# Patient Record
Sex: Male | Born: 1980 | Race: Black or African American | Hispanic: No | Marital: Single | Smoking: Current some day smoker
Health system: Southern US, Community
[De-identification: ages and names within clinical notes are randomized; demographics above are authoritative.]

## PROBLEM LIST (undated history)

## (undated) DIAGNOSIS — I10 Essential (primary) hypertension: Secondary | ICD-10-CM

## (undated) DIAGNOSIS — K219 Gastro-esophageal reflux disease without esophagitis: Secondary | ICD-10-CM

## (undated) DIAGNOSIS — R569 Unspecified convulsions: Secondary | ICD-10-CM

## (undated) DIAGNOSIS — F17200 Nicotine dependence, unspecified, uncomplicated: Secondary | ICD-10-CM

## (undated) HISTORY — DX: Gastro-esophageal reflux disease without esophagitis: K21.9

## (undated) HISTORY — DX: Essential (primary) hypertension: I10

## (undated) HISTORY — DX: Unspecified convulsions: R56.9

---

## 2004-09-21 ENCOUNTER — Emergency Department: Payer: Self-pay | Admitting: General Practice

## 2004-11-23 ENCOUNTER — Emergency Department: Payer: Self-pay | Admitting: Emergency Medicine

## 2005-01-06 ENCOUNTER — Emergency Department: Payer: Self-pay | Admitting: Emergency Medicine

## 2005-02-24 ENCOUNTER — Emergency Department: Payer: Self-pay | Admitting: Emergency Medicine

## 2005-11-09 ENCOUNTER — Emergency Department: Payer: Self-pay | Admitting: Emergency Medicine

## 2006-03-20 ENCOUNTER — Emergency Department: Payer: Self-pay | Admitting: Emergency Medicine

## 2006-04-03 ENCOUNTER — Emergency Department: Payer: Self-pay | Admitting: Emergency Medicine

## 2006-07-28 ENCOUNTER — Emergency Department: Payer: Self-pay | Admitting: Emergency Medicine

## 2007-04-12 ENCOUNTER — Emergency Department: Payer: Self-pay | Admitting: Unknown Physician Specialty

## 2007-06-02 ENCOUNTER — Emergency Department: Payer: Self-pay | Admitting: Emergency Medicine

## 2008-07-15 ENCOUNTER — Emergency Department: Payer: Self-pay | Admitting: Emergency Medicine

## 2010-03-06 ENCOUNTER — Emergency Department (HOSPITAL_COMMUNITY): Admission: EM | Admit: 2010-03-06 | Discharge: 2010-03-06 | Payer: Self-pay | Admitting: Emergency Medicine

## 2011-04-29 ENCOUNTER — Emergency Department: Payer: Self-pay | Admitting: Emergency Medicine

## 2011-07-20 ENCOUNTER — Emergency Department (HOSPITAL_COMMUNITY): Payer: Medicaid Other

## 2011-07-20 ENCOUNTER — Emergency Department (HOSPITAL_COMMUNITY)
Admission: EM | Admit: 2011-07-20 | Discharge: 2011-07-20 | Disposition: A | Discharge status: Home / Self Care | Payer: Medicaid Other | Attending: Emergency Medicine | Admitting: Emergency Medicine

## 2011-07-20 DIAGNOSIS — M25569 Pain in unspecified knee: Secondary | ICD-10-CM | POA: Insufficient documentation

## 2011-07-20 DIAGNOSIS — IMO0002 Reserved for concepts with insufficient information to code with codable children: Secondary | ICD-10-CM | POA: Insufficient documentation

## 2013-06-29 ENCOUNTER — Emergency Department: Payer: Self-pay | Admitting: Emergency Medicine

## 2014-07-26 ENCOUNTER — Emergency Department: Payer: Self-pay | Admitting: Emergency Medicine

## 2014-07-26 LAB — CBC WITH DIFFERENTIAL/PLATELET
Basophil #: 0.1 10*3/uL (ref 0.0–0.1)
Basophil %: 1 %
EOS PCT: 1.4 %
Eosinophil #: 0.1 10*3/uL (ref 0.0–0.7)
HCT: 49.2 % (ref 40.0–52.0)
HGB: 15.4 g/dL (ref 13.0–18.0)
LYMPHS ABS: 1.3 10*3/uL (ref 1.0–3.6)
LYMPHS PCT: 24.3 %
MCH: 26.6 pg (ref 26.0–34.0)
MCHC: 31.4 g/dL — AB (ref 32.0–36.0)
MCV: 85 fL (ref 80–100)
MONOS PCT: 11.4 %
Monocyte #: 0.6 x10 3/mm (ref 0.2–1.0)
NEUTROS PCT: 61.9 %
Neutrophil #: 3.3 10*3/uL (ref 1.4–6.5)
PLATELETS: 265 10*3/uL (ref 150–440)
RBC: 5.8 10*6/uL (ref 4.40–5.90)
RDW: 14.9 % — AB (ref 11.5–14.5)
WBC: 5.3 10*3/uL (ref 3.8–10.6)

## 2014-07-26 LAB — URINALYSIS, COMPLETE
Bacteria: NONE SEEN
Bilirubin,UR: NEGATIVE
Glucose,UR: NEGATIVE mg/dL (ref 0–75)
Ketone: NEGATIVE
Leukocyte Esterase: NEGATIVE
NITRITE: NEGATIVE
PH: 5 (ref 4.5–8.0)
Protein: 100
Specific Gravity: 1.012 (ref 1.003–1.030)
WBC UR: 1 /HPF (ref 0–5)

## 2014-07-26 LAB — DRUG SCREEN, URINE
Amphetamines, Ur Screen: NEGATIVE (ref ?–1000)
BARBITURATES, UR SCREEN: NEGATIVE (ref ?–200)
BENZODIAZEPINE, UR SCRN: NEGATIVE (ref ?–200)
COCAINE METABOLITE, UR ~~LOC~~: POSITIVE (ref ?–300)
Cannabinoid 50 Ng, Ur ~~LOC~~: NEGATIVE (ref ?–50)
MDMA (ECSTASY) UR SCREEN: NEGATIVE (ref ?–500)
METHADONE, UR SCREEN: NEGATIVE (ref ?–300)
OPIATE, UR SCREEN: NEGATIVE (ref ?–300)
Phencyclidine (PCP) Ur S: NEGATIVE (ref ?–25)
Tricyclic, Ur Screen: NEGATIVE (ref ?–1000)

## 2014-07-26 LAB — COMPREHENSIVE METABOLIC PANEL
ALBUMIN: 3.4 g/dL (ref 3.4–5.0)
ALT: 22 U/L
AST: 25 U/L (ref 15–37)
Alkaline Phosphatase: 85 U/L
Anion Gap: 10 (ref 7–16)
BUN: 13 mg/dL (ref 7–18)
Bilirubin,Total: 0.2 mg/dL (ref 0.2–1.0)
CO2: 22 mmol/L (ref 21–32)
CREATININE: 1.29 mg/dL (ref 0.60–1.30)
Calcium, Total: 8.5 mg/dL (ref 8.5–10.1)
Chloride: 108 mmol/L — ABNORMAL HIGH (ref 98–107)
EGFR (African American): >60
Glucose: 84 mg/dL (ref 65–99)
Osmolality: 279 (ref 275–301)
POTASSIUM: 3.9 mmol/L (ref 3.5–5.1)
SODIUM: 140 mmol/L (ref 136–145)
Total Protein: 7.3 g/dL (ref 6.4–8.2)

## 2014-07-26 LAB — ETHANOL: Ethanol: 99 mg/dL

## 2014-10-15 ENCOUNTER — Inpatient Hospital Stay (HOSPITAL_COMMUNITY)
Admission: EM | Admit: 2014-10-15 | Discharge: 2014-10-21 | DRG: 208 | Payer: Medicaid Other | Attending: General Surgery | Admitting: General Surgery

## 2014-10-15 ENCOUNTER — Emergency Department (HOSPITAL_COMMUNITY): Payer: Medicaid Other

## 2014-10-15 ENCOUNTER — Inpatient Hospital Stay (HOSPITAL_COMMUNITY): Payer: Medicaid Other

## 2014-10-15 ENCOUNTER — Encounter (HOSPITAL_COMMUNITY): Payer: Self-pay | Admitting: Radiology

## 2014-10-15 DIAGNOSIS — R4182 Altered mental status, unspecified: Secondary | ICD-10-CM

## 2014-10-15 DIAGNOSIS — S0101XA Laceration without foreign body of scalp, initial encounter: Secondary | ICD-10-CM | POA: Diagnosis present

## 2014-10-15 DIAGNOSIS — S32029A Unspecified fracture of second lumbar vertebra, initial encounter for closed fracture: Secondary | ICD-10-CM | POA: Diagnosis present

## 2014-10-15 DIAGNOSIS — Z9119 Patient's noncompliance with other medical treatment and regimen: Secondary | ICD-10-CM | POA: Diagnosis present

## 2014-10-15 DIAGNOSIS — R400 Somnolence: Secondary | ICD-10-CM | POA: Diagnosis not present

## 2014-10-15 DIAGNOSIS — S2241XA Multiple fractures of ribs, right side, initial encounter for closed fracture: Principal | ICD-10-CM | POA: Diagnosis present

## 2014-10-15 DIAGNOSIS — Z01818 Encounter for other preprocedural examination: Secondary | ICD-10-CM

## 2014-10-15 DIAGNOSIS — J9690 Respiratory failure, unspecified, unspecified whether with hypoxia or hypercapnia: Secondary | ICD-10-CM

## 2014-10-15 DIAGNOSIS — T402X5A Adverse effect of other opioids, initial encounter: Secondary | ICD-10-CM | POA: Diagnosis not present

## 2014-10-15 DIAGNOSIS — S32039A Unspecified fracture of third lumbar vertebra, initial encounter for closed fracture: Secondary | ICD-10-CM | POA: Diagnosis present

## 2014-10-15 DIAGNOSIS — S32059A Unspecified fracture of fifth lumbar vertebra, initial encounter for closed fracture: Secondary | ICD-10-CM | POA: Diagnosis present

## 2014-10-15 DIAGNOSIS — S32009A Unspecified fracture of unspecified lumbar vertebra, initial encounter for closed fracture: Secondary | ICD-10-CM | POA: Diagnosis present

## 2014-10-15 DIAGNOSIS — T1490XA Injury, unspecified, initial encounter: Secondary | ICD-10-CM

## 2014-10-15 DIAGNOSIS — S32019A Unspecified fracture of first lumbar vertebra, initial encounter for closed fracture: Secondary | ICD-10-CM | POA: Diagnosis present

## 2014-10-15 DIAGNOSIS — G40909 Epilepsy, unspecified, not intractable, without status epilepticus: Secondary | ICD-10-CM

## 2014-10-15 DIAGNOSIS — S32049A Unspecified fracture of fourth lumbar vertebra, initial encounter for closed fracture: Secondary | ICD-10-CM | POA: Diagnosis present

## 2014-10-15 DIAGNOSIS — Z23 Encounter for immunization: Secondary | ICD-10-CM | POA: Diagnosis not present

## 2014-10-15 DIAGNOSIS — R569 Unspecified convulsions: Secondary | ICD-10-CM | POA: Diagnosis present

## 2014-10-15 DIAGNOSIS — T1490 Injury, unspecified: Secondary | ICD-10-CM | POA: Diagnosis present

## 2014-10-15 HISTORY — DX: Nicotine dependence, unspecified, uncomplicated: F17.200

## 2014-10-15 LAB — CBC
HCT: 41.1 % (ref 39.0–52.0)
HEMATOCRIT: 43.6 % (ref 39.0–52.0)
HEMOGLOBIN: 14.5 g/dL (ref 13.0–17.0)
Hemoglobin: 13.6 g/dL (ref 13.0–17.0)
MCH: 27.3 pg (ref 26.0–34.0)
MCH: 27.2 pg (ref 26.0–34.0)
MCHC: 33.3 g/dL (ref 30.0–36.0)
MCHC: 33.1 g/dL (ref 30.0–36.0)
MCV: 82.1 fL (ref 78.0–100.0)
MCV: 82.2 fL (ref 78.0–100.0)
PLATELETS: 251 10*3/uL (ref 150–400)
Platelets: 303 10*3/uL (ref 150–400)
RBC: 5.31 MIL/uL (ref 4.22–5.81)
RBC: 5 MIL/uL (ref 4.22–5.81)
RDW: 17.5 % — ABNORMAL HIGH (ref 11.5–15.5)
RDW: 17.6 % — AB (ref 11.5–15.5)
WBC: 18.6 10*3/uL — ABNORMAL HIGH (ref 4.0–10.5)
WBC: 23.1 10*3/uL — ABNORMAL HIGH (ref 4.0–10.5)

## 2014-10-15 LAB — RAPID URINE DRUG SCREEN, HOSP PERFORMED
AMPHETAMINES: NOT DETECTED
BARBITURATES: NOT DETECTED
Benzodiazepines: NOT DETECTED
Cocaine: POSITIVE — AB
OPIATES: NOT DETECTED
TETRAHYDROCANNABINOL: NOT DETECTED

## 2014-10-15 LAB — COMPREHENSIVE METABOLIC PANEL
ALT: 166 U/L — ABNORMAL HIGH (ref 0–53)
ANION GAP: 18 — AB (ref 5–15)
AST: 252 U/L — ABNORMAL HIGH (ref 0–37)
Albumin: 3.6 g/dL (ref 3.5–5.2)
Alkaline Phosphatase: 98 U/L (ref 39–117)
BILIRUBIN TOTAL: 0.3 mg/dL (ref 0.3–1.2)
BUN: 13 mg/dL (ref 6–23)
CALCIUM: 9.1 mg/dL (ref 8.4–10.5)
CHLORIDE: 99 meq/L (ref 96–112)
CO2: 24 mEq/L (ref 19–32)
CREATININE: 1.65 mg/dL — AB (ref 0.50–1.35)
GFR, EST AFRICAN AMERICAN: 61 mL/min — AB (ref >90)
GFR, EST NON AFRICAN AMERICAN: 52 mL/min — AB (ref >90)
GLUCOSE: 98 mg/dL (ref 70–99)
Potassium: 3.9 mEq/L (ref 3.7–5.3)
Sodium: 141 mEq/L (ref 137–147)
Total Protein: 7.3 g/dL (ref 6.0–8.3)

## 2014-10-15 LAB — URINALYSIS, ROUTINE W REFLEX MICROSCOPIC
BILIRUBIN URINE: NEGATIVE
GLUCOSE, UA: NEGATIVE mg/dL
KETONES UR: NEGATIVE mg/dL
Leukocytes, UA: NEGATIVE
Nitrite: NEGATIVE
Protein, ur: 100 mg/dL — AB
Specific Gravity, Urine: 1.007 (ref 1.005–1.030)
UROBILINOGEN UA: 0.2 mg/dL (ref 0.0–1.0)
pH: 7.5 (ref 5.0–8.0)

## 2014-10-15 LAB — TYPE AND SCREEN
ABO/RH(D): B POS
Antibody Screen: NEGATIVE
Unit division: 0
Unit division: 0

## 2014-10-15 LAB — ETHANOL: ALCOHOL ETHYL (B): 227 mg/dL — AB (ref 0–11)

## 2014-10-15 LAB — PREPARE FRESH FROZEN PLASMA
UNIT DIVISION: 0
Unit division: 0

## 2014-10-15 LAB — I-STAT CHEM 8, ED
BUN: 12 mg/dL (ref 6–23)
CHLORIDE: 102 meq/L (ref 96–112)
Calcium, Ion: 1.04 mmol/L — ABNORMAL LOW (ref 1.12–1.23)
Creatinine, Ser: 1.8 mg/dL — ABNORMAL HIGH (ref 0.50–1.35)
Glucose, Bld: 95 mg/dL (ref 70–99)
HCT: 49 % (ref 39.0–52.0)
Hemoglobin: 16.7 g/dL (ref 13.0–17.0)
Potassium: 3.7 mEq/L (ref 3.7–5.3)
Sodium: 140 mEq/L (ref 137–147)
TCO2: 22 mmol/L (ref 0–100)

## 2014-10-15 LAB — BASIC METABOLIC PANEL
Anion gap: 18 — ABNORMAL HIGH (ref 5–15)
BUN: 13 mg/dL (ref 6–23)
CO2: 23 mEq/L (ref 19–32)
CREATININE: 1.45 mg/dL — AB (ref 0.50–1.35)
Calcium: 8.6 mg/dL (ref 8.4–10.5)
Chloride: 102 mEq/L (ref 96–112)
GFR calc non Af Amer: 61 mL/min — ABNORMAL LOW (ref >90)
GFR, EST AFRICAN AMERICAN: 71 mL/min — AB (ref >90)
Glucose, Bld: 78 mg/dL (ref 70–99)
Potassium: 4.1 mEq/L (ref 3.7–5.3)
Sodium: 143 mEq/L (ref 137–147)

## 2014-10-15 LAB — I-STAT ARTERIAL BLOOD GAS, ED
ACID-BASE DEFICIT: 3 mmol/L — AB (ref 0.0–2.0)
BICARBONATE: 24.5 meq/L — AB (ref 20.0–24.0)
O2 Saturation: 100 %
Patient temperature: 98.6
TCO2: 26 mmol/L (ref 0–100)
pCO2 arterial: 51.6 mmHg — ABNORMAL HIGH (ref 35.0–45.0)
pH, Arterial: 7.284 — ABNORMAL LOW (ref 7.350–7.450)
pO2, Arterial: 417 mmHg — ABNORMAL HIGH (ref 80.0–100.0)

## 2014-10-15 LAB — PROTIME-INR
INR: 1.03 (ref 0.00–1.49)
PROTHROMBIN TIME: 13.6 s (ref 11.6–15.2)

## 2014-10-15 LAB — URINE MICROSCOPIC-ADD ON

## 2014-10-15 LAB — TRIGLYCERIDES: Triglycerides: 242 mg/dL — ABNORMAL HIGH (ref <150)

## 2014-10-15 LAB — ABO/RH: ABO/RH(D): B POS

## 2014-10-15 LAB — MRSA PCR SCREENING: MRSA by PCR: NEGATIVE

## 2014-10-15 LAB — I-STAT CG4 LACTIC ACID, ED: LACTIC ACID, VENOUS: 4.79 mmol/L — AB (ref 0.5–2.2)

## 2014-10-15 LAB — BLOOD PRODUCT ORDER (VERBAL) VERIFICATION

## 2014-10-15 MED ORDER — ENOXAPARIN SODIUM 40 MG/0.4ML ~~LOC~~ SOLN
40.0000 mg | SUBCUTANEOUS | Status: DC
  Administered 2014-10-15 – 2014-10-17 (×3): 40 mg via SUBCUTANEOUS
  Filled 2014-10-15 (×4): qty 0.4

## 2014-10-15 MED ORDER — PROPOFOL 10 MG/ML IV EMUL
INTRAVENOUS | Status: AC
  Administered 2014-10-15: 30 mg
  Filled 2014-10-15: qty 100

## 2014-10-15 MED ORDER — SODIUM CHLORIDE 0.9 % IV SOLN
INTRAVENOUS | Status: DC
  Administered 2014-10-15 – 2014-10-18 (×3): via INTRAVENOUS
  Filled 2014-10-15: qty 1000

## 2014-10-15 MED ORDER — PROPOFOL 10 MG/ML IV EMUL
INTRAVENOUS | Status: AC
  Filled 2014-10-15: qty 100

## 2014-10-15 MED ORDER — SUCCINYLCHOLINE CHLORIDE 20 MG/ML IJ SOLN
INTRAMUSCULAR | Status: AC
Start: 2014-10-15 — End: 2014-10-15
  Filled 2014-10-15: qty 1

## 2014-10-15 MED ORDER — SODIUM CHLORIDE 0.9 % IV SOLN
INTRAVENOUS | Status: AC | PRN
  Administered 2014-10-15 (×2): 1000 mL via INTRAVENOUS

## 2014-10-15 MED ORDER — PANTOPRAZOLE SODIUM 40 MG PO TBEC
40.0000 mg | DELAYED_RELEASE_TABLET | Freq: Every day | ORAL | Status: DC
  Administered 2014-10-17: 40 mg via ORAL
  Filled 2014-10-15: qty 1

## 2014-10-15 MED ORDER — ONDANSETRON HCL 4 MG/2ML IJ SOLN: 4.0000 mg | Freq: Four times a day (QID) | INTRAMUSCULAR | Status: DC | PRN

## 2014-10-15 MED ORDER — DEXTROSE-NACL 5-0.9 % IV SOLN: INTRAVENOUS | Status: DC

## 2014-10-15 MED ORDER — CHLORHEXIDINE GLUCONATE 0.12 % MT SOLN
15.0000 mL | Freq: Two times a day (BID) | OROMUCOSAL | Status: DC
  Administered 2014-10-15 – 2014-10-21 (×12): 15 mL via OROMUCOSAL
  Filled 2014-10-15 (×16): qty 15

## 2014-10-15 MED ORDER — MIDAZOLAM HCL 2 MG/2ML IJ SOLN
2.0000 mg | INTRAMUSCULAR | Status: DC | PRN
  Administered 2014-10-15 (×2): 2 mg via INTRAVENOUS
  Filled 2014-10-15 (×3): qty 2

## 2014-10-15 MED ORDER — ROCURONIUM BROMIDE 50 MG/5ML IV SOLN
INTRAVENOUS | Status: AC
Start: 2014-10-15 — End: 2014-10-15
  Administered 2014-10-15: 50 mg
  Filled 2014-10-15: qty 2

## 2014-10-15 MED ORDER — LIDOCAINE HCL (CARDIAC) 20 MG/ML IV SOLN
INTRAVENOUS | Status: AC
  Filled 2014-10-15: qty 5

## 2014-10-15 MED ORDER — LORAZEPAM 2 MG/ML IJ SOLN
INTRAMUSCULAR | Status: AC | PRN
  Administered 2014-10-15: 2 mg via INTRAVENOUS

## 2014-10-15 MED ORDER — LEVETIRACETAM IN NACL 1000 MG/100ML IV SOLN
1000.0000 mg | Freq: Two times a day (BID) | INTRAVENOUS | Status: DC
  Administered 2014-10-15 – 2014-10-18 (×6): 1000 mg via INTRAVENOUS
  Filled 2014-10-15 (×9): qty 100

## 2014-10-15 MED ORDER — FENTANYL CITRATE 0.05 MG/ML IJ SOLN: 100.0000 ug | INTRAMUSCULAR | Status: DC | PRN

## 2014-10-15 MED ORDER — ETOMIDATE 2 MG/ML IV SOLN
INTRAVENOUS | Status: AC
  Filled 2014-10-15: qty 20

## 2014-10-15 MED ORDER — PROPOFOL 10 MG/ML IV BOLUS
INTRAVENOUS | Status: AC | PRN
  Administered 2014-10-15: 1000 ug via INTRAVENOUS

## 2014-10-15 MED ORDER — MIDAZOLAM HCL 2 MG/2ML IJ SOLN
INTRAMUSCULAR | Status: AC
  Filled 2014-10-15: qty 6

## 2014-10-15 MED ORDER — FENTANYL CITRATE 0.05 MG/ML IJ SOLN
100.0000 ug | INTRAMUSCULAR | Status: DC | PRN
Start: 2014-10-15 — End: 2014-10-18
  Administered 2014-10-15 – 2014-10-18 (×7): 100 ug via INTRAVENOUS
  Filled 2014-10-15 (×8): qty 2

## 2014-10-15 MED ORDER — PANTOPRAZOLE SODIUM 40 MG IV SOLR
40.0000 mg | Freq: Every day | INTRAVENOUS | Status: DC
  Administered 2014-10-15 – 2014-10-16 (×2): 40 mg via INTRAVENOUS
  Filled 2014-10-15 (×4): qty 40

## 2014-10-15 MED ORDER — MIDAZOLAM HCL 2 MG/2ML IJ SOLN
2.0000 mg | INTRAMUSCULAR | Status: DC | PRN
Start: 2014-10-15 — End: 2014-10-17
  Administered 2014-10-15 (×3): 2 mg via INTRAVENOUS
  Filled 2014-10-15 (×3): qty 2

## 2014-10-15 MED ORDER — MIDAZOLAM HCL 5 MG/5ML IJ SOLN
INTRAMUSCULAR | Status: AC | PRN
  Administered 2014-10-15 (×3): 5 mg via INTRAVENOUS

## 2014-10-15 MED ORDER — IOHEXOL 300 MG/ML  SOLN
100.0000 mL | Freq: Once | INTRAMUSCULAR | Status: AC | PRN
  Administered 2014-10-15: 100 mL via INTRAVENOUS

## 2014-10-15 MED ORDER — ETOMIDATE 2 MG/ML IV SOLN
INTRAVENOUS | Status: AC | PRN
  Administered 2014-10-15: 20 mg via INTRAVENOUS

## 2014-10-15 MED ORDER — SUCCINYLCHOLINE CHLORIDE 20 MG/ML IJ SOLN
INTRAMUSCULAR | Status: AC | PRN
  Administered 2014-10-15: 100 mg via INTRAVENOUS

## 2014-10-15 MED ORDER — PROPOFOL 10 MG/ML IV EMUL
0.0000 ug/kg/min | INTRAVENOUS | Status: DC
  Administered 2014-10-15 (×4): 40 ug/kg/min via INTRAVENOUS
  Administered 2014-10-16: 10 ug/kg/min via INTRAVENOUS
  Administered 2014-10-16: 40 ug/kg/min via INTRAVENOUS
  Filled 2014-10-15 (×5): qty 100

## 2014-10-15 MED ORDER — CETYLPYRIDINIUM CHLORIDE 0.05 % MT LIQD
7.0000 mL | Freq: Four times a day (QID) | OROMUCOSAL | Status: DC
  Administered 2014-10-15 – 2014-10-20 (×12): 7 mL via OROMUCOSAL

## 2014-10-15 MED ORDER — SODIUM CHLORIDE 0.9 % IV SOLN
2000.0000 mg | INTRAVENOUS | Status: AC
  Administered 2014-10-15: 2000 mg via INTRAVENOUS
  Filled 2014-10-15: qty 20

## 2014-10-15 MED ORDER — ONDANSETRON HCL 4 MG PO TABS: 4.0000 mg | ORAL_TABLET | Freq: Four times a day (QID) | ORAL | Status: DC | PRN

## 2014-10-15 MED ORDER — MIDAZOLAM HCL 2 MG/2ML IJ SOLN
INTRAMUSCULAR | Status: AC
  Administered 2014-10-15: 5 mg via INTRAVENOUS
  Filled 2014-10-15: qty 6
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
## 2014-10-15 NOTE — Procedures (Signed)
EEG report.  Brief clinical history:  33 yo M with sz in the setting of cocaine and etoh use. Unclear if other substances as well.  Technique: this is a 17 channel routine scalp EEG performed at the bedside with bipolar and monopolar montages arranged in accordance to the international 10/20 system of electrode placement. One channel was dedicated to EKG recording.  Patient is intubated on the vent, sedated with propofol. No activating procedures performed.  Description: patient is intubated on the vent and sedated with propofol and thus assessment of the basic background rhythm was not feasible. For the most part, the recording displays features consistent with sedation and there is not evidence of electrographic seizures or epileptiform discharges. No pathologic areas of slowing seen.  EKG showed sinus rhythm.  Impression: this is a normal EEG with findings compatible with sedation and no evidence of electrographic seizures.  Please, be aware that a normal EEG does not exclude the possibility of epilepsy.  Clinical correlation is advised.  Wyatt Portelasvaldo Alton Tremblay, MD
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
## 2014-10-15 NOTE — ED Notes (Signed)
Diprivan Gtt increased to 6930mcg/kg/min 18cc on pump

## 2014-10-15 NOTE — ED Notes (Signed)
Returned from ct scan , pt given 1000 NS bolus in ct scan due to drop in Sb/p , pressure up with one liter in
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
## 2014-10-15 NOTE — ED Notes (Signed)
Pt transported to 2s room 7

## 2014-10-15 NOTE — Plan of Care (Signed)
Problem: Phase I Progression Outcomes Goal: GIProphysixis Outcome: Completed/Met Date Met:  10/15/14 Goal: Oral Care per Protocol Outcome: Completed/Met Date Met:  10/15/14 Goal: HOB elevated 30 degrees Outcome: Completed/Met Date Met:  10/15/14 Goal: VAP prevention protocol initiated Outcome: Completed/Met Date Met:  10/15/14

## 2014-10-15 NOTE — ED Notes (Signed)
Patient having seizures at this time.  New orders per Dr Ranae PalmsYelverton.

## 2014-10-15 NOTE — ED Notes (Signed)
To Ct with RN.
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
## 2014-10-15 NOTE — Progress Notes (Signed)
Patient ID: Daniel Goodman, male   DOB: 03/07/1981, 33 y.o.   MRN: [id number] Just admitted from ED. BS equal but coarse. Weaning but sedated. Adjusted IVF. Violeta GelinasBurke Fuller Makin, MD, MPH, FACS Trauma: [phone] General Surgery: [phone]
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
## 2014-10-15 NOTE — ED Notes (Signed)
5 mg Versed given due to pt pulling at tube , Neuro MD at bedside

## 2014-10-15 NOTE — ED Notes (Signed)
Attempted report 

## 2014-10-15 NOTE — Progress Notes (Signed)
Patient ID: Daniel Goodman, male   DOB: 03/06/1981, [age] y.oCarlye Goodman.   MRN: [id number] Called by RN for possible pupil difference. On my exam, pupils equal and 2mm. Sluggish. EEG results noted. Weaning well. Hope to extubate in the AM. Daniel GelinasBurke Lotoya Casella, MD, MPH, FACS Trauma: [phone] General Surgery: [phone]

## 2014-10-15 NOTE — Progress Notes (Signed)
EEG completed, results pending. 

## 2014-10-15 NOTE — Progress Notes (Signed)
Patient ID: Daniel Goodman, male   DOB: May 26, 1981, 33 y.o.   MRN: [id number] I met with his parents at the bedside and updated them on the plan of care. Georganna Skeans, MD, MPH, FACS Trauma: [phone] General Surgery: [phone]
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
## 2014-10-16 ENCOUNTER — Inpatient Hospital Stay (HOSPITAL_COMMUNITY): Payer: Medicaid Other

## 2014-10-16 ENCOUNTER — Encounter (HOSPITAL_COMMUNITY): Payer: Self-pay | Admitting: Certified Registered Nurse Anesthetist

## 2014-10-16 LAB — BASIC METABOLIC PANEL
ANION GAP: 12 (ref 5–15)
BUN: 11 mg/dL (ref 6–23)
CHLORIDE: 104 meq/L (ref 96–112)
CO2: 24 mEq/L (ref 19–32)
Calcium: 8.7 mg/dL (ref 8.4–10.5)
Creatinine, Ser: 1.2 mg/dL (ref 0.50–1.35)
GFR calc non Af Amer: 77 mL/min — ABNORMAL LOW (ref >90)
GFR, EST AFRICAN AMERICAN: 89 mL/min — AB (ref >90)
Glucose, Bld: 91 mg/dL (ref 70–99)
POTASSIUM: 4.3 meq/L (ref 3.7–5.3)
Sodium: 140 mEq/L (ref 137–147)

## 2014-10-16 LAB — CBC
HCT: 36.4 % — ABNORMAL LOW (ref 39.0–52.0)
Hemoglobin: 11.8 g/dL — ABNORMAL LOW (ref 13.0–17.0)
MCH: 26.9 pg (ref 26.0–34.0)
MCHC: 32.4 g/dL (ref 30.0–36.0)
MCV: 82.9 fL (ref 78.0–100.0)
Platelets: 217 10*3/uL (ref 150–400)
RBC: 4.39 MIL/uL (ref 4.22–5.81)
RDW: 18 % — ABNORMAL HIGH (ref 11.5–15.5)
WBC: 8.5 10*3/uL (ref 4.0–10.5)

## 2014-10-16 LAB — CDS SEROLOGY

## 2014-10-16 MED ORDER — INFLUENZA VAC SPLIT QUAD 0.5 ML IM SUSY
0.5000 mL | PREFILLED_SYRINGE | INTRAMUSCULAR | Status: DC
  Filled 2014-10-16: qty 0.5

## 2014-10-16 MED ORDER — HYDROMORPHONE HCL 1 MG/ML IJ SOLN
1.0000 mg | INTRAMUSCULAR | Status: DC | PRN
  Administered 2014-10-16 – 2014-10-18 (×5): 1 mg via INTRAVENOUS
  Filled 2014-10-16 (×5): qty 1

## 2014-10-16 MED ORDER — OXYCODONE HCL 5 MG PO TABS
5.0000 mg | ORAL_TABLET | ORAL | Status: DC | PRN
  Administered 2014-10-16 – 2014-10-18 (×9): 10 mg via ORAL
  Filled 2014-10-16 (×9): qty 2

## 2014-10-16 MED ORDER — PNEUMOCOCCAL VAC POLYVALENT 25 MCG/0.5ML IJ INJ
0.5000 mL | INJECTION | INTRAMUSCULAR | Status: DC
  Filled 2014-10-16: qty 0.5

## 2014-10-16 MED ORDER — DEXTROSE 5 % IV SOLN
1000.0000 mg | Freq: Three times a day (TID) | INTRAVENOUS | Status: DC | PRN
  Administered 2014-10-17 – 2014-10-18 (×3): 1000 mg via INTRAVENOUS
  Filled 2014-10-16 (×7): qty 10

## 2014-10-16 NOTE — Procedures (Signed)
Extubation Procedure Note  Patient Details:   Name: Daniel Goodman DOB: 12/15/1980 MRN: [id number]   Airway Documentation:     Evaluation  O2 sats: stable throughout Complications: No apparent complications Patient did tolerate procedure well. Bilateral Breath Sounds: Clear, Diminished Suctioning: Airway Yes   Patient extubated to 3L nasal cannula per MD order.  Positive cuff leak noted.  No evidence of stridor.  Sats currently 100%.  Vitals are stable.  Patient able to speak post extubation.  Incentive spirometry performed with achieved goal of 500.  No apparent complications.  Durwin GlazeBrown, Gavon Majano N 10/16/2014, 10:49 AM

## 2014-10-16 NOTE — Progress Notes (Signed)
[employer] has been notified of the intent to extubate patient, they informed RN to call when patient is medically released by MD so officer can pick patient up. Instructed to call back and notify them if patient becomes resistant to care. Called to inform house security of this current matter and expressed a need to have them present when extubated just in case the patient becomes resistant to care, they are contacting [facility] personally before we follow through with extubation. He is currently cooperative with care and following commands. VSS, family informed and updated on current condition. Will continue to monitor. Franki CabotBrianna Mahima Hottle, RN

## 2014-10-16 NOTE — Progress Notes (Signed)
Trooper Art therapistMann at bedside and updating family on patient's legal status. Will proceed with extubation. Contacted MD to clarify plan for patient, informed RN that patient may be staying in the hospital for about 2 more days depending on physical therapy. Will pass on information to W[employer] W[employer]ley Jr[employer] Companyrooper Mann with [employer]. Franki CabotBrianna Odena Mcquaid, RN
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
## 2014-10-17 DIAGNOSIS — G40909 Epilepsy, unspecified, not intractable, without status epilepticus: Secondary | ICD-10-CM

## 2014-10-17 LAB — CBC
HEMATOCRIT: 33.6 % — AB (ref 39.0–52.0)
Hemoglobin: 11.1 g/dL — ABNORMAL LOW (ref 13.0–17.0)
MCH: 27.5 pg (ref 26.0–34.0)
MCHC: 33 g/dL (ref 30.0–36.0)
MCV: 83.2 fL (ref 78.0–100.0)
PLATELETS: 184 10*3/uL (ref 150–400)
RBC: 4.04 MIL/uL — AB (ref 4.22–5.81)
RDW: 17.3 % — ABNORMAL HIGH (ref 11.5–15.5)
WBC: 8.8 10*3/uL (ref 4.0–10.5)

## 2014-10-17 LAB — BASIC METABOLIC PANEL
Anion gap: 11 (ref 5–15)
BUN: 7 mg/dL (ref 6–23)
CALCIUM: 8.8 mg/dL (ref 8.4–10.5)
CO2: 25 mEq/L (ref 19–32)
CREATININE: 1.05 mg/dL (ref 0.50–1.35)
Chloride: 100 mEq/L (ref 96–112)
GFR calc Af Amer: >90 mL/min (ref >90)
GFR calc non Af Amer: >90 mL/min (ref >90)
Glucose, Bld: 87 mg/dL (ref 70–99)
Potassium: 3.8 mEq/L (ref 3.7–5.3)
Sodium: 136 mEq/L — ABNORMAL LOW (ref 137–147)

## 2014-10-17 MED ORDER — THIAMINE HCL 100 MG/ML IJ SOLN
100.0000 mg | Freq: Every day | INTRAMUSCULAR | Status: DC
  Administered 2014-10-19: 100 mg via INTRAVENOUS
  Filled 2014-10-17 (×3): qty 1

## 2014-10-17 MED ORDER — LORAZEPAM 1 MG PO TABS
1.0000 mg | ORAL_TABLET | Freq: Four times a day (QID) | ORAL | Status: AC | PRN
  Administered 2014-10-17 – 2014-10-18 (×3): 1 mg via ORAL
  Filled 2014-10-17 (×3): qty 1

## 2014-10-17 MED ORDER — PNEUMOCOCCAL VAC POLYVALENT 25 MCG/0.5ML IJ INJ
0.5000 mL | INJECTION | INTRAMUSCULAR | Status: AC
  Administered 2014-10-18: 0.5 mL via INTRAMUSCULAR
  Filled 2014-10-17: qty 0.5

## 2014-10-17 MED ORDER — KETOROLAC TROMETHAMINE 15 MG/ML IJ SOLN
15.0000 mg | Freq: Four times a day (QID) | INTRAMUSCULAR | Status: DC
  Administered 2014-10-17 – 2014-10-18 (×5): 15 mg via INTRAVENOUS
  Filled 2014-10-17 (×6): qty 1

## 2014-10-17 MED ORDER — INFLUENZA VAC SPLIT QUAD 0.5 ML IM SUSY: 0.5000 mL | PREFILLED_SYRINGE | INTRAMUSCULAR | Status: DC | PRN

## 2014-10-17 MED ORDER — FOLIC ACID 1 MG PO TABS
1.0000 mg | ORAL_TABLET | Freq: Every day | ORAL | Status: DC
  Administered 2014-10-17 – 2014-10-21 (×5): 1 mg via ORAL
  Filled 2014-10-17 (×5): qty 1

## 2014-10-17 MED ORDER — LORAZEPAM 2 MG/ML IJ SOLN
1.0000 mg | Freq: Four times a day (QID) | INTRAMUSCULAR | Status: AC | PRN
  Filled 2014-10-17: qty 1

## 2014-10-17 MED ORDER — VITAMIN B-1 100 MG PO TABS
100.0000 mg | ORAL_TABLET | Freq: Every day | ORAL | Status: DC
Start: 2014-10-17 — End: 2014-10-18
  Administered 2014-10-17 – 2014-10-18 (×2): 100 mg via ORAL
  Filled 2014-10-17 (×2): qty 1

## 2014-10-17 MED ORDER — ADULT MULTIVITAMIN W/MINERALS CH
1.0000 | ORAL_TABLET | Freq: Every day | ORAL | Status: DC
  Administered 2014-10-17 – 2014-10-21 (×5): 1 via ORAL
  Filled 2014-10-17 (×5): qty 1

## 2014-10-17 MED ORDER — BOOST / RESOURCE BREEZE PO LIQD
1.0000 | Freq: Three times a day (TID) | ORAL | Status: DC
  Administered 2014-10-17 – 2014-10-21 (×6): 1 via ORAL
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
## 2014-10-17 NOTE — Progress Notes (Signed)
Called report to the receiving nurse on 5N - explained that this is a confidential pt - password provided.  [employer] were notified of transfer of the pt to the floor and nurse was advised that the [employer] need to be contacted when the pt is to be discharged - information on front of the pt's chart.  Advised that the pt is not aware of the status of his cousin and that the family is aware of his legal status.

## 2014-10-18 DIAGNOSIS — S32009A Unspecified fracture of unspecified lumbar vertebra, initial encounter for closed fracture: Secondary | ICD-10-CM

## 2014-10-18 DIAGNOSIS — S0101XA Laceration without foreign body of scalp, initial encounter: Secondary | ICD-10-CM

## 2014-10-18 DIAGNOSIS — S2241XA Multiple fractures of ribs, right side, initial encounter for closed fracture: Secondary | ICD-10-CM

## 2014-10-18 HISTORY — DX: Multiple fractures of ribs, right side, initial encounter for closed fracture: S22.41XA

## 2014-10-18 HISTORY — DX: Laceration without foreign body of scalp, initial encounter: S01.01XA

## 2014-10-18 HISTORY — DX: Unspecified fracture of unspecified lumbar vertebra, initial encounter for closed fracture: S32.009A

## 2014-10-18 MED ORDER — OXYCODONE HCL 5 MG PO TABS
10.0000 mg | ORAL_TABLET | ORAL | Status: DC | PRN
  Administered 2014-10-18 – 2014-10-19 (×6): 20 mg via ORAL
  Administered 2014-10-19: 15 mg via ORAL
  Administered 2014-10-19 – 2014-10-21 (×7): 20 mg via ORAL
  Filled 2014-10-18 (×15): qty 4

## 2014-10-18 MED ORDER — METHOCARBAMOL 750 MG PO TABS
1500.0000 mg | ORAL_TABLET | Freq: Four times a day (QID) | ORAL | Status: DC
  Administered 2014-10-18 – 2014-10-21 (×12): 1500 mg via ORAL
  Filled 2014-10-18 (×18): qty 2

## 2014-10-18 MED ORDER — DOCUSATE SODIUM 100 MG PO CAPS
100.0000 mg | ORAL_CAPSULE | Freq: Two times a day (BID) | ORAL | Status: DC
  Administered 2014-10-18 – 2014-10-21 (×7): 100 mg via ORAL
  Filled 2014-10-18 (×8): qty 1

## 2014-10-18 MED ORDER — BACITRACIN ZINC 500 UNIT/GM EX OINT
TOPICAL_OINTMENT | Freq: Two times a day (BID) | CUTANEOUS | Status: DC
  Administered 2014-10-18 – 2014-10-21 (×7): via TOPICAL
  Filled 2014-10-18: qty 28.35

## 2014-10-18 MED ORDER — HYDROMORPHONE HCL 1 MG/ML IJ SOLN
0.5000 mg | INTRAMUSCULAR | Status: DC | PRN
  Administered 2014-10-19 (×2): 0.5 mg via INTRAVENOUS
  Filled 2014-10-18 (×2): qty 1

## 2014-10-18 MED ORDER — ENOXAPARIN SODIUM 30 MG/0.3ML ~~LOC~~ SOLN
30.0000 mg | Freq: Two times a day (BID) | SUBCUTANEOUS | Status: DC
  Administered 2014-10-18 – 2014-10-21 (×7): 30 mg via SUBCUTANEOUS
  Filled 2014-10-18 (×8): qty 0.3

## 2014-10-18 MED ORDER — NAPROXEN 500 MG PO TABS
500.0000 mg | ORAL_TABLET | Freq: Two times a day (BID) | ORAL | Status: DC
  Administered 2014-10-18 – 2014-10-21 (×6): 500 mg via ORAL
  Filled 2014-10-18 (×8): qty 1

## 2014-10-18 MED ORDER — LEVETIRACETAM 500 MG PO TABS
1000.0000 mg | ORAL_TABLET | Freq: Two times a day (BID) | ORAL | Status: DC
  Administered 2014-10-18 – 2014-10-21 (×6): 1000 mg via ORAL
  Filled 2014-10-18 (×7): qty 2

## 2014-10-18 MED ORDER — POLYETHYLENE GLYCOL 3350 17 G PO PACK
17.0000 g | PACK | Freq: Every day | ORAL | Status: DC
  Administered 2014-10-18 – 2014-10-21 (×4): 17 g via ORAL
  Filled 2014-10-18 (×5): qty 1

## 2014-10-18 NOTE — Progress Notes (Signed)
UR completed.  PT recommending outpt PT at d/c. Pt has Medicaid coverage so should be able to fill any necessary prescriptions. Has family support also (parents).  Carlyle LipaMichelle Laveda Demedeiros, RN BSN MHA CCM Trauma/Neuro ICU Case Manager [phone]
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
## 2014-10-18 NOTE — Plan of Care (Signed)
Problem: Phase III Progression Outcomes Goal: Pain controlled with appropriate interventions Outcome: Progressing Goal: Hemodynamically stable Outcome: Progressing Goal: Activity advanced as tolerated Outcome: Progressing
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
## 2014-10-18 NOTE — Progress Notes (Signed)
Dale DurhamMichael Jeffries, PA said that patient does need suicide precautions.

## 2014-10-19 MED ORDER — ACETAMINOPHEN 325 MG PO TABS: 325.0000 mg | ORAL_TABLET | Freq: Four times a day (QID) | ORAL | Status: DC | PRN

## 2014-10-19 MED ORDER — VITAMIN B-1 100 MG PO TABS
100.0000 mg | ORAL_TABLET | Freq: Every day | ORAL | Status: DC
  Administered 2014-10-20 – 2014-10-21 (×2): 100 mg via ORAL
  Filled 2014-10-19 (×2): qty 1
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
## 2014-10-21 ENCOUNTER — Emergency Department: Payer: Self-pay | Admitting: Emergency Medicine

## 2014-10-21 MED ORDER — ACETAMINOPHEN 325 MG PO TABS: 325.0000 mg | ORAL_TABLET | Freq: Four times a day (QID) | ORAL | Status: DC | PRN

## 2014-10-21 MED ORDER — LEVETIRACETAM 1000 MG PO TABS: 1000.0000 mg | ORAL_TABLET | Freq: Two times a day (BID) | ORAL | Status: DC

## 2014-10-21 MED ORDER — OXYCODONE HCL 10 MG PO TABS: 5.0000 mg | ORAL_TABLET | Freq: Four times a day (QID) | ORAL | Status: DC | PRN

## 2014-10-21 MED ORDER — THIAMINE HCL 100 MG PO TABS
100.0000 mg | ORAL_TABLET | Freq: Every day | ORAL | Status: DC
Start: 2014-10-21 — End: 2015-09-26

## 2014-10-21 MED ORDER — METHOCARBAMOL 750 MG PO TABS: 1500.0000 mg | ORAL_TABLET | Freq: Four times a day (QID) | ORAL | Status: DC

## 2014-10-21 MED ORDER — BACITRACIN ZINC 500 UNIT/GM EX OINT: TOPICAL_OINTMENT | Freq: Two times a day (BID) | CUTANEOUS | Status: DC

## 2014-10-21 MED ORDER — FOLIC ACID 1 MG PO TABS: 1.0000 mg | ORAL_TABLET | Freq: Every day | ORAL | Status: DC

## 2014-10-21 MED ORDER — ADULT MULTIVITAMIN W/MINERALS CH: 1.0000 | ORAL_TABLET | Freq: Every day | ORAL | Status: DC

## 2014-10-21 MED ORDER — POLYETHYLENE GLYCOL 3350 17 G PO PACK: 17.0000 g | PACK | Freq: Every day | ORAL | Status: DC

## 2014-10-21 MED ORDER — NAPROXEN 500 MG PO TABS: 500.0000 mg | ORAL_TABLET | Freq: Two times a day (BID) | ORAL | Status: DC

## 2014-10-21 MED ORDER — DSS 100 MG PO CAPS: 100.0000 mg | ORAL_CAPSULE | Freq: Two times a day (BID) | ORAL | Status: DC
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
## 2014-10-21 NOTE — Discharge Instructions (Signed)
Make appointments with a neurologist and primary care provider ASAP!!!  Rib Fracture A rib fracture is a break or crack in one of the bones of the ribs. The ribs are a group of long, curved bones that wrap around your chest and attach to your spine. They protect your lungs and other organs in the chest cavity. A broken or cracked rib is often painful, but most do not cause other problems. Most rib fractures heal on their own over time. However, rib fractures can be more serious if multiple ribs are broken or if broken ribs move out of place and push against other structures. CAUSES   A direct blow to the chest. For example, this could happen during contact sports, a car accident, or a fall against a hard object.  Repetitive movements with high force, such as pitching a baseball or having severe coughing spells. SYMPTOMS   Pain when you breathe in or cough.  Pain when someone presses on the injured area. DIAGNOSIS  Your caregiver will perform a physical exam. Various imaging tests may be ordered to confirm the diagnosis and to look for related injuries. These tests may include a chest X-ray, computed tomography (CT), magnetic resonance imaging (MRI), or a bone scan. TREATMENT  Rib fractures usually heal on their own in 1-3 months. The longer healing period is often associated with a continued cough or other aggravating activities. During the healing period, pain control is very important. Medication is usually given to control pain. Hospitalization or surgery may be needed for more severe injuries, such as those in which multiple ribs are broken or the ribs have moved out of place.  HOME CARE INSTRUCTIONS   Avoid strenuous activity and any activities or movements that cause pain. Be careful during activities and avoid bumping the injured rib.  Gradually increase activity as directed by your caregiver.  Only take over-the-counter or prescription medications as directed by your caregiver. Do not  take other medications without asking your caregiver first.  Apply ice to the injured area for the first 1-2 days after you have been treated or as directed by your caregiver. Applying ice helps to reduce inflammation and pain.  Put ice in a plastic bag.  Place a towel between your skin and the bag.   Leave the ice on for 15-20 minutes at a time, every 2 hours while you are awake.  Perform deep breathing as directed by your caregiver. This will help prevent pneumonia, which is a common complication of a broken rib. Your caregiver may instruct you to:  Take deep breaths several times a day.  Try to cough several times a day, holding a pillow against the injured area.  Use a device called an incentive spirometer to practice deep breathing several times a day.  Drink enough fluids to keep your urine clear or pale yellow. This will help you avoid constipation.   Do not wear a rib belt or binder. These restrict breathing, which can lead to pneumonia.  SEEK IMMEDIATE MEDICAL CARE IF:   You have a fever.   You have difficulty breathing or shortness of breath.   You develop a continual cough, or you cough up thick or bloody sputum.  You feel sick to your stomach (nausea), throw up (vomit), or have abdominal pain.   You have worsening pain not controlled with medications.  MAKE SURE YOU:  Understand these instructions.  Will watch your condition.  Will get help right away if you are not doing well  or get worse. Document Released: 10/25/2005 Document Revised: 06/27/2013 Document Reviewed: 12/27/2012 [facility] Patient Information 2015 [place], [place]. This information is not intended to replace advice given to you by your health care provider. Make sure you discuss any questions you have with your health care provider.  Transverse Process Fracture A fracture of a bone is the same as a break in the bone. A fracture of a transverse process is a break of a part of one of the bones  in the spine. This part extends out from the side of the main body of the bone (called the vertebral body). A transverse process is shaped like a wing. They extend from both the left and right sides of the vertebral body. Many of these injuries occur in the thoracic spine (the upper and middle parts of the back) and the lumbar spine (the low back area). In the elderly, these injuries can also occur in the lower neck area and are affected by age-related arthritis problems and osteoporosis (thinning of bone). It takes a lot of force to cause this type of fracture. Because other organs and so many other parts of the spine are close to the transverse processes, these fractures usually occur at the same time as injuries to:  Other bones.  Organs.  Possibly the spinal cord. Even if there is only a break to one transverse process, your caregiver will take steps to make sure that a nearby organ has not also been injured.  CAUSES  Most of these injuries occur as a result of a variety of accidents such as:  Falls.  Motor vehicle accidents.  Recreational activities.  A smaller number occur due to:  Industrial, Water engineer, and aviation accidents.  Gunshot wounds and direct blows to the back.  Parachuting incidents. SYMPTOMS  Patients with transverse process fractures have severe pain even if the actual break is small or limited and there is no injury to nearby bones, organs, or the spinal cord. More severe or complex injuries involving other bones and/or organs may include:  Deformity of the back bones.  Swelling/bruising over the injured area.  Limited ability to move the affected area.  There may be injury to a nearby:  Lung.  Kidney.  Spleen.  Liver. Injury to nearby nerves can cause partial or complete loss of function of the bladder and/or bowels. More severe injuries can also cause:  Loss of sensation and/or strength in the arms/hands (if the break is in the lower part of  the neck), legs, feet, and toes.  Spinal cord injury that leads to paralysis. DIAGNOSIS In most cases, a broken bone will be suspected by what happened just prior to the onset of back and/or neck pain. X-rays and special imaging (CT scan and MRI imaging) are used to confirm the diagnosis as well as finding out the type and severity of the break or breaks. These tests are important for guiding and planning treatment. There are times when special imaging cannot be done. MRI cannot be done if there is an implanted metallic device (such as a pacemaker). In these cases, other tests and imaging are done. Other tests may be done if your caregiver is concerned about injuries to internal organs near the break of the transverse process. For example, an ultrasound may be ordered to examine the liver, spleen, or kidneys. If there has been nerve damage near the break, additional tests may be ordered in order to find out:  Exactly how much damage has occurred.  To plan  for what can be done to help. These include:  Tests of nerve function through muscles (nerve conduction studies and electromyography).  Tests of bladder function (urodynamics).  Tests that focus on defining specific nerve problems before surgery and what improvement has come about after surgery (evoked potentials). TREATMENT If the injury is limited to a break of a transverse process with no other bone or organ injury, hospital care may not be necessary. Medication for pain control, special back bracing, and limitations in activity are done first followed by physical therapy later. Complex breaks, multiple fractures of the spine, or unstable injuries can damage the spinal cord and may require an operation to remove pressure from the nerves and/or spinal cord and to stabilize the broken pieces of bone. Specialty care may be needed if there has been injury to an internal organ near a broken transverse process. Each individual set of injuries is  unique, and your caregiver will review many things when planning the best approach that will give the highest likelihood of a good outcome.  HOME CARE INSTRUCTIONS There is pain and stiffness in the back for weeks after a transverse process fracture. Bed rest, pain medicine, and a slow return to activity are generally recommended. Neck and back braces may be helpful in reducing pain and increasing mobility. When your pain allows, simple walking will help to begin the process of returning to normal activities. Exercises to improve motion and to strengthen the back may also be useful after the initial pain goes away. This will be guided by your caregiver and the team (nurses, physical therapists, occupational therapists, etc.) involved with your ongoing care. For the elderly, treatment for osteoporosis may be essential to help reduce your risk of fractures in the future. Arrange for follow-up care as recommended to assure proper long-term care and prevention of further spine injury. It is important that you participate in your return to good health. The failure to follow up as recommended with your caregiver, orthopedic referral or other provider may result in the bone not healing properly with possible chronic disability or pain. SEEK MEDICAL CARE IF:  Pain is not effectively controlled with medication.  You feel unable to decrease pain medication over time as planned.  Activity level is not improving as planned and/or expected. SEEK IMMEDIATE MEDICAL CARE IF:  You have increasing pain, vomiting, or are unable to move around at all.  You have numbness, tingling, weakness, or paralysis of any part of your body.  You have loss of normal bowel or bladder control.  You have difficulty breathing, cough, fever, chest or abdominal pain. MAKE SURE YOU:   Understand these instructions.  Will watch your condition.  Will get help right away if you are not doing well or get worse. Document Released:  02/09/2007 Document Revised: 03/11/2014 Document Reviewed: 10/10/2007 [facility] Patient Information 2015 [place], [place]. This information is not intended to replace advice given to you by your health care provider. Make sure you discuss any questions you have with your health care provider.
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
## 2015-09-25 DIAGNOSIS — J323 Chronic sphenoidal sinusitis: Secondary | ICD-10-CM | POA: Insufficient documentation

## 2015-09-25 DIAGNOSIS — J36 Peritonsillar abscess: Secondary | ICD-10-CM | POA: Diagnosis not present

## 2015-09-25 DIAGNOSIS — R0602 Shortness of breath: Secondary | ICD-10-CM | POA: Insufficient documentation

## 2015-09-25 DIAGNOSIS — R0600 Dyspnea, unspecified: Secondary | ICD-10-CM | POA: Insufficient documentation

## 2015-09-25 DIAGNOSIS — R591 Generalized enlarged lymph nodes: Secondary | ICD-10-CM | POA: Diagnosis not present

## 2015-09-25 DIAGNOSIS — Z79899 Other long term (current) drug therapy: Secondary | ICD-10-CM | POA: Diagnosis not present

## 2015-09-25 DIAGNOSIS — G40909 Epilepsy, unspecified, not intractable, without status epilepticus: Secondary | ICD-10-CM | POA: Diagnosis not present

## 2015-09-26 ENCOUNTER — Emergency Department: Payer: Medicaid Other

## 2015-09-26 ENCOUNTER — Encounter: Payer: Self-pay | Admitting: Radiology

## 2015-09-26 ENCOUNTER — Observation Stay
Admission: EM | Admit: 2015-09-26 | Discharge: 2015-09-26 | Disposition: A | Discharge status: Home / Self Care | Payer: Medicaid Other | Attending: Internal Medicine | Admitting: Internal Medicine

## 2015-09-26 DIAGNOSIS — J36 Peritonsillar abscess: Secondary | ICD-10-CM

## 2015-09-26 DIAGNOSIS — K651 Peritoneal abscess: Secondary | ICD-10-CM | POA: Diagnosis present

## 2015-09-26 HISTORY — DX: Peritonsillar abscess: J36

## 2015-09-26 HISTORY — DX: Peritoneal abscess: K65.1

## 2015-09-26 LAB — COMPREHENSIVE METABOLIC PANEL
ALBUMIN: 3.1 g/dL — AB (ref 3.5–5.0)
ALK PHOS: 65 U/L (ref 38–126)
ALT: 13 U/L — AB (ref 17–63)
AST: 12 U/L — AB (ref 15–41)
Anion gap: 11 (ref 5–15)
BILIRUBIN TOTAL: 0.3 mg/dL (ref 0.3–1.2)
BUN: 14 mg/dL (ref 6–20)
CO2: 26 mmol/L (ref 22–32)
CREATININE: 1.13 mg/dL (ref 0.61–1.24)
Calcium: 9 mg/dL (ref 8.9–10.3)
Chloride: 98 mmol/L — ABNORMAL LOW (ref 101–111)
GFR calc Af Amer: >60 mL/min (ref >60)
GLUCOSE: 95 mg/dL (ref 65–99)
POTASSIUM: 3.6 mmol/L (ref 3.5–5.1)
Sodium: 135 mmol/L (ref 135–145)
TOTAL PROTEIN: 8 g/dL (ref 6.5–8.1)

## 2015-09-26 LAB — CBC
HEMATOCRIT: 43.7 % (ref 40.0–52.0)
HEMOGLOBIN: 14.5 g/dL (ref 13.0–18.0)
MCH: 27.9 pg (ref 26.0–34.0)
MCHC: 33.2 g/dL (ref 32.0–36.0)
MCV: 83.9 fL (ref 80.0–100.0)
Platelets: 403 10*3/uL (ref 150–440)
RBC: 5.21 MIL/uL (ref 4.40–5.90)
RDW: 14.6 % — ABNORMAL HIGH (ref 11.5–14.5)
WBC: 19.5 10*3/uL — AB (ref 3.8–10.6)

## 2015-09-26 MED ORDER — ADULT MULTIVITAMIN W/MINERALS CH
1.0000 | ORAL_TABLET | Freq: Every day | ORAL | Status: DC
  Administered 2015-09-26: 1 via ORAL
  Filled 2015-09-26: qty 1

## 2015-09-26 MED ORDER — PREDNISONE 50 MG PO TABS: 50.0000 mg | ORAL_TABLET | Freq: Every day | ORAL | Status: DC

## 2015-09-26 MED ORDER — BENZOCAINE 20 % MT SOLN
Freq: Once | OROMUCOSAL | Status: AC
  Administered 2015-09-26: 1 via OROMUCOSAL

## 2015-09-26 MED ORDER — LIDOCAINE-EPINEPHRINE (PF) 1 %-1:200000 IJ SOLN
INTRAMUSCULAR | Status: AC
  Administered 2015-09-26: 30 mL via INTRADERMAL
  Filled 2015-09-26: qty 30

## 2015-09-26 MED ORDER — MORPHINE SULFATE (PF) 2 MG/ML IV SOLN
2.0000 mg | Freq: Once | INTRAVENOUS | Status: AC
  Administered 2015-09-26: 2 mg via INTRAVENOUS
  Filled 2015-09-26: qty 1

## 2015-09-26 MED ORDER — LIDOCAINE-EPINEPHRINE (PF) 1 %-1:200000 IJ SOLN
30.0000 mL | Freq: Once | INTRAMUSCULAR | Status: AC
  Administered 2015-09-26: 30 mL via INTRADERMAL

## 2015-09-26 MED ORDER — DOCUSATE SODIUM 100 MG PO CAPS
100.0000 mg | ORAL_CAPSULE | Freq: Two times a day (BID) | ORAL | Status: DC
  Administered 2015-09-26: 100 mg via ORAL
  Filled 2015-09-26: qty 1

## 2015-09-26 MED ORDER — CLINDAMYCIN PHOSPHATE 900 MG/50ML IV SOLN
900.0000 mg | Freq: Once | INTRAVENOUS | Status: AC
  Administered 2015-09-26: 900 mg via INTRAVENOUS
  Filled 2015-09-26: qty 50

## 2015-09-26 MED ORDER — AMOXICILLIN-POT CLAVULANATE 875-125 MG PO TABS: 1.0000 | ORAL_TABLET | Freq: Two times a day (BID) | ORAL | Status: DC

## 2015-09-26 MED ORDER — PREDNISONE 10 MG PO TABS: ORAL_TABLET | ORAL | Status: DC

## 2015-09-26 MED ORDER — ACETAMINOPHEN 650 MG RE SUPP: 650.0000 mg | Freq: Four times a day (QID) | RECTAL | Status: DC | PRN

## 2015-09-26 MED ORDER — DEXAMETHASONE SODIUM PHOSPHATE 10 MG/ML IJ SOLN
20.0000 mg | Freq: Once | INTRAMUSCULAR | Status: AC
Start: 2015-09-26 — End: 2015-09-26
  Administered 2015-09-26: 20 mg via INTRAVENOUS
  Filled 2015-09-26: qty 2

## 2015-09-26 MED ORDER — NAPROXEN 500 MG PO TABS
500.0000 mg | ORAL_TABLET | Freq: Two times a day (BID) | ORAL | Status: DC
  Administered 2015-09-26: 500 mg via ORAL
  Filled 2015-09-26: qty 1

## 2015-09-26 MED ORDER — BENZOCAINE 20 % MT SOLN
OROMUCOSAL | Status: AC
  Administered 2015-09-26: 1 via OROMUCOSAL
  Filled 2015-09-26: qty 5

## 2015-09-26 MED ORDER — ONDANSETRON HCL 4 MG PO TABS: 4.0000 mg | ORAL_TABLET | Freq: Four times a day (QID) | ORAL | Status: DC | PRN

## 2015-09-26 MED ORDER — SODIUM CHLORIDE 0.9 % IV BOLUS (SEPSIS)
1000.0000 mL | Freq: Once | INTRAVENOUS | Status: AC
  Administered 2015-09-26: 1000 mL via INTRAVENOUS

## 2015-09-26 MED ORDER — POLYETHYLENE GLYCOL 3350 17 G PO PACK
17.0000 g | PACK | Freq: Every day | ORAL | Status: DC
  Filled 2015-09-26: qty 1

## 2015-09-26 MED ORDER — ACETAMINOPHEN 325 MG PO TABS: 650.0000 mg | ORAL_TABLET | Freq: Four times a day (QID) | ORAL | Status: DC | PRN

## 2015-09-26 MED ORDER — ONDANSETRON HCL 4 MG/2ML IJ SOLN: 4.0000 mg | Freq: Four times a day (QID) | INTRAMUSCULAR | Status: DC | PRN

## 2015-09-26 MED ORDER — LEVETIRACETAM 500 MG PO TABS
1000.0000 mg | ORAL_TABLET | Freq: Two times a day (BID) | ORAL | Status: DC
  Administered 2015-09-26: 1000 mg via ORAL
  Filled 2015-09-26: qty 2

## 2015-09-26 MED ORDER — ONDANSETRON HCL 4 MG/2ML IJ SOLN
4.0000 mg | Freq: Once | INTRAMUSCULAR | Status: AC
  Administered 2015-09-26: 4 mg via INTRAVENOUS
  Filled 2015-09-26: qty 2

## 2015-09-26 MED ORDER — METHOCARBAMOL 500 MG PO TABS
1500.0000 mg | ORAL_TABLET | Freq: Four times a day (QID) | ORAL | Status: DC
  Administered 2015-09-26: 1500 mg via ORAL
  Filled 2015-09-26: qty 3

## 2015-09-26 MED ORDER — SODIUM CHLORIDE 0.9 % IV SOLN
1.5000 g | Freq: Four times a day (QID) | INTRAVENOUS | Status: DC
  Administered 2015-09-26: 1.5 g via INTRAVENOUS
  Filled 2015-09-26 (×3): qty 1.5

## 2015-09-26 MED ORDER — IOHEXOL 300 MG/ML  SOLN
75.0000 mL | Freq: Once | INTRAMUSCULAR | Status: AC | PRN
  Administered 2015-09-26: 75 mL via INTRAVENOUS

## 2015-09-26 MED ORDER — MORPHINE SULFATE (PF) 2 MG/ML IV SOLN: 2.0000 mg | INTRAVENOUS | Status: DC | PRN

## 2015-09-26 MED ORDER — VITAMIN B-1 100 MG PO TABS
100.0000 mg | ORAL_TABLET | Freq: Every day | ORAL | Status: DC
  Administered 2015-09-26: 100 mg via ORAL
  Filled 2015-09-26: qty 1

## 2015-09-26 MED ORDER — FOLIC ACID 1 MG PO TABS
1.0000 mg | ORAL_TABLET | Freq: Every day | ORAL | Status: DC
  Administered 2015-09-26: 1 mg via ORAL
  Filled 2015-09-26: qty 1
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
## 2015-09-26 NOTE — Progress Notes (Signed)
09/26/2015 12:53 PM  BP 142/93 mmHg  Pulse 80  Temp(Src) 98.7 F (37.1 C) (Oral)  Resp 18  Ht 6\' 1"  (1.854 m)  Wt 94.802 kg (209 lb)  BMI 27.58 kg/m2  SpO2 97% Patient discharged per MD orders. No breathing difficulties or throat soreness.  Discharge instructions reviewed with patient and patient verbalized understanding. IV removed per policy. Prescriptions discussed and given to patient. Discharged ambulatory escorted by significant other.  Ron ParkerHerron, Taysia Rivere D, RN

## 2015-09-26 NOTE — ED Notes (Signed)
Patient transported to CT 

## 2015-09-26 NOTE — Consult Note (Signed)
..   Daniel Goodman, Daniel Goodman [id number] 06/02/1981 Daniel NatalMichael S Diamond, MD  Reason for Consult: Peritonsillar abscess  HPI: 34 year old male with history of sore throat and tonsillitis.  Se[facility] several days ago and per patient was diagnosed with peritonsillar abscess at that time and given oral antibiotics and told to call back if worse.  Presents here to ED with worsening swelling and pain and difficulty tolerating secretions.  Underwent CT scan and found to have large left sided peritonsillar abscess.  Call for management of drainage.  Allergies: No Known Allergies  ROS: Review of systems normal other than 12 systems except per HPI.  PMH:  Past Medical History  Diagnosis Date  . Smoker     FH: No family history on file.  SH:  Social History   Social History  . Marital Status: Single    Spouse Name: N/A  . Number of Children: N/A  . Years of Education: N/A   Occupational History  . Not on file.   Social History Main Topics  . Smoking status: Not on file  . Smokeless tobacco: Not on file  . Alcohol Use: Not on file  . Drug Use: Not on file  . Sexual Activity: Not on file   Other Topics Concern  . Not on file   Social History Narrative    PSH: No past surgical history on file.  Physical  Exam:  GEN-  CN 2-12 grossly intact and symmetric. "hot potato" voice present EARS- EAC normal BL. OC/OP-  Significant tonsillar edema on left tonsil with deviation of uvula to right with fullness laterally to palpation EXT-Skin warm and dry. NECK- bilateral cervical lymphadenopathy left greater than right NOSE- Nasal cavity without polyps or purulence.  CT scan evaluation and shows large left sided peritonsillar abscess and surrounding phlegmon  A/P: Drainage here in ED.  Please see separate OP note regarding details.  Significant cavity drained and ~10ccs of purulence removed.  Recommend admit for observation/iv fluids/iv abx/steroids.  Will re-evaluate later  today.   Daniel Goodman 09/26/2015 7:13 AM

## 2015-09-26 NOTE — Op Note (Signed)
..  09/26/2015  7:19 AM    [name], Daniel  [id number]   Pre-Op Dx:  Sore throat, Peritonsillar abscess  Post-op Dx: Sore throat, Peritonsillar abscess  Proc: Drainage of Peritonsillar abscess  Surg: Daniel Goodman  Anes:  Local and Topical  EBL:  10ccs  Comp:  None  Findings:  Large left sided peritonsillar abscess drained with approximately 10ccs of purulence removed.  Procedure: With the patient in a comfortable sitting position, verbal consent was obtained and the procedure was discussed.  The patient's oral cavity was next anesthetized with topical Hurricane spray.  After this, using a 25 gauge needle, 2 cc's of 1% Lidocaine with 1:100,000 Epi were injected just superior and lateral to the patient's left tonsil.  An 4318 gauge seeker needle was inserted and a large abscess pocket was encountered and drained of approximately 10ccs of purulent fluid.  After this, an 11 blade scalpel was used to open the abscess pocket and septations were divided using a kelly clamp.  The patient tolerated the procedure well.  Following this  The patient was returned to the ED.  Dispo:  Admit to medicine for hydration.  Will follow.  Plan: Routine drop use and water precautions.  Recheck my office three weeks.   [name] 7:19 AM 09/26/2015

## 2015-09-26 NOTE — ED Notes (Signed)
Pt dx with "abscess on his tonsils" 11/13 states not better and feels like having a hard time breathing.

## 2015-09-26 NOTE — H&P (Signed)
Daniel Goodman is an 34 y.o. male.   Chief Complaint: shortness of breath HPI: history presents emergency department complaining of difficulty breathing. He was seen at [facility] emergency department and diagnosed with a peritonsillar abscess. The patient was given antibiotics and discharged. He awoke this evening gasping for breath. At our emergency department CT of the neck showed peritonsillar abscess with narrowing of his airway. ENT subsequently drained the abscess which relieved the patient's shortness of breath.  Due to the earlier impact on the patient's airway emergency department staff called for observation.  Past Medical History  Diagnosis Date  . Smoker     No past surgical history on file.  No family history on file. Social History:  has no tobacco, alcohol, and drug history on file.  Allergies: No Known Allergies  Prior to Admission medications   Medication Sig Start Date End Date Taking? Authorizing Provider  acetaminophen (TYLENOL) 325 MG tablet Take 1-2 tablets (325-650 mg total) by mouth every 6 (six) hours as needed for fever, headache, mild pain or moderate pain. 10/21/14   Nat Christen, PA-C  bacitracin ointment Apply topically 2 (two) times daily. Use tube patient received from the hospital. 10/21/14   Nat Christen, PA-C  docusate sodium 100 MG CAPS Take 100 mg by mouth 2 (two) times daily. 10/21/14   Nat Christen, PA-C  folic acid (FOLVITE) 1 MG tablet Take 1 tablet (1 mg total) by mouth daily. 10/21/14   Nat Christen, PA-C  levETIRAcetam (KEPPRA) 1000 MG tablet Take 1 tablet (1,000 mg total) by mouth 2 (two) times daily. 10/21/14   Nat Christen, PA-C  methocarbamol (ROBAXIN) 750 MG tablet Take 2 tablets (1,500 mg total) by mouth 4 (four) times daily. 10/21/14   Nat Christen, PA-C  Multiple Vitamin (MULTIVITAMIN WITH MINERALS) TABS tablet Take 1 tablet by mouth daily. 10/21/14   Nat Christen, PA-C  naproxen (NAPROSYN) 500 MG tablet Take 1 tablet (500 mg total) by  mouth 2 (two) times daily with a meal. 10/21/14   Nat Christen, PA-C  oxyCODONE 10 MG TABS Take 0.5-1.5 tablets (5-15 mg total) by mouth every 6 (six) hours as needed ($RemoveBe'5mg'xFjWUUmDx$  for mild pain, $RemoveBe'10mg'JpzQLpSiS$  for moderate pain, $RemoveBefore'15mg'inloNRsglbeSv$  for severe pain). 10/21/14   Nat Christen, PA-C  polyethylene glycol (MIRALAX / GLYCOLAX) packet Take 17 g by mouth daily. 10/21/14   Nat Christen, PA-C  thiamine 100 MG tablet Take 1 tablet (100 mg total) by mouth daily. 10/21/14   Nat Christen, PA-C     Results for orders placed or performed during the hospital encounter of 09/26/15 (from the past 48 hour(s))  CBC     Status: Abnormal   Collection Time: 09/26/15 12:45 AM  Result Value Ref Range   WBC 19.5 (H) 3.8 - 10.6 K/uL   RBC 5.21 4.40 - 5.90 MIL/uL   Hemoglobin 14.5 13.0 - 18.0 g/dL   HCT 43.7 40.0 - 52.0 %   MCV 83.9 80.0 - 100.0 fL   MCH 27.9 26.0 - 34.0 pg   MCHC 33.2 32.0 - 36.0 g/dL   RDW 14.6 (H) 11.5 - 14.5 %   Platelets 403 150 - 440 K/uL  Comprehensive metabolic panel     Status: Abnormal   Collection Time: 09/26/15 12:45 AM  Result Value Ref Range   Sodium 135 135 - 145 mmol/L   Potassium 3.6 3.5 - 5.1 mmol/L   Chloride 98 (L) 101 - 111 mmol/L  CO2 26 22 - 32 mmol/L   Glucose, Bld 95 65 - 99 mg/dL   BUN 14 6 - 20 mg/dL   Creatinine, Ser 1.13 0.61 - 1.24 mg/dL   Calcium 9.0 8.9 - 10.3 mg/dL   Total Protein 8.0 6.5 - 8.1 g/dL   Albumin 3.1 (L) 3.5 - 5.0 g/dL   AST 12 (L) 15 - 41 U/L   ALT 13 (L) 17 - 63 U/L   Alkaline Phosphatase 65 38 - 126 U/L   Total Bilirubin 0.3 0.3 - 1.2 mg/dL   GFR calc non Af Amer >60 >60 mL/min   GFR calc Af Amer >60 >60 mL/min    Comment: (NOTE) The eGFR has been calculated using the CKD EPI equation. This calculation has not been validated in all clinical situations. eGFR's persistently <60 mL/min signify possible Chronic Kidney Disease.    Anion gap 11 5 - 15   Ct Soft Tissue Neck W Contrast  09/26/2015  CLINICAL DATA:  Diagnosed with tonsillar abscess  November 13th, not feeling better. Difficulty breathing, multiple voice. EXAM: CT NECK WITH CONTRAST TECHNIQUE: Multidetector CT imaging of the neck was performed using the standard protocol following the bolus administration of intravenous contrast. CONTRAST:  25mL OMNIPAQUE IOHEXOL 300 MG/ML  SOLN COMPARISON:  CT head July 26, 2014 FINDINGS: Pharynx and larynx: LEFT palatine tonsil enlargement with bilobed 3.5 x 2.7 x 1.3 cm LEFT peritonsillar abscess. Effacement LEFT parapharyngeal fat tissue planes. Moderate to severely effaced airway at the level of the hypopharynx. Mild edema within LEFT base of tongue extending to the glossoepiglottic fold. Epiglottis is not enlarged. Normal appearance of the larynx. Salivary glands: Normal. Thyroid: Normal. Lymph nodes: Lymphadenopathy measuring up to 2 cm short access LEFT level IIa. Vascular: Normal. Limited intracranial: Normal. Visualized orbits: Normal. Mastoids and visualized paranasal sinuses: Lobulated soft tissue partially imaged LEFT sphenoid sinus. Mastoid air cells are well aerated. Skeleton: Normal. Upper chest: Well-aerated. IMPRESSION: LEFT palatine tonsillitis with superimposed 3.5 x 2.7 x 1.3 cm LEFT tonsillar abscess, resulting in moderate to severe airway effacement. LEFT neck lymphadenopathy is likely reactive. Similar LEFT sphenoid sinusitis. Acute findings discussed with and reconfirmed by [facility] BROWN on 09/26/2015 at 2:16 am. Electronically Signed   By: Elon Alas M.D.   On: 09/26/2015 02:17    Review of Systems  Constitutional: Negative for fever and chills.  HENT: Positive for sore throat. Negative for tinnitus.   Eyes: Negative for blurred vision and redness.  Respiratory: Negative for cough and shortness of breath.   Cardiovascular: Negative for chest pain, palpitations, orthopnea and PND.  Gastrointestinal: Negative for nausea, vomiting, abdominal pain and diarrhea.  Genitourinary: Negative for dysuria, urgency and  frequency.  Musculoskeletal: Negative for myalgias and joint pain.  Skin: Negative for rash.       No lesions  Neurological: Negative for speech change, focal weakness and weakness.  Endo/Heme/Allergies: Does not bruise/bleed easily.       No temperature intolerance  Psychiatric/Behavioral: Negative for depression and suicidal ideas.    Blood pressure 156/111, pulse 94, temperature 99.6 F (37.6 C), temperature source Oral, resp. rate 18, height $RemoveBe'6\' 1"'SuFUwnMFe$  (1.854 m), weight 94.802 kg (209 lb), SpO2 96 %. Physical Exam  Nursing note and vitals reviewed. Constitutional: He is oriented to person, place, and time. He appears well-developed and well-nourished. No distress.  HENT:  Head: Normocephalic and atraumatic.  Mouth/Throat: Oropharynx is clear and moist.  Eyes: Conjunctivae and EOM are normal. Pupils are equal, round, and reactive to  light. No scleral icterus.  Neck: Normal range of motion. Neck supple. No JVD present. No tracheal deviation present. No thyromegaly present.  Cardiovascular: Normal rate, regular rhythm and normal heart sounds.  Exam reveals no gallop and no friction rub.   No murmur heard. Respiratory: Effort normal and breath sounds normal. No respiratory distress.  GI: Soft. Bowel sounds are normal. He exhibits no distension. There is no tenderness.  Genitourinary:  Deferred  Musculoskeletal: Normal range of motion. He exhibits no edema.  Lymphadenopathy:    He has no cervical adenopathy.  Neurological: He is alert and oriented to person, place, and time. No cranial nerve deficit.  Skin: Skin is warm and dry. No rash noted. No erythema.  Psychiatric: He has a normal mood and affect. His behavior is normal. Judgment and thought content normal.     Assessment/Plan This is a 34 year old F American male admitted for peritonsillar abscess. 1. Peritonsillar abscess: Drained without difficulty. The patient does not have signs or symptoms of sepsis. He was given 1 dose of IV  clindamycin in the emergency department. We will continue with Unasyn IV and transition the patient to oral antibiotics. The patient is in no pain and he is eating without difficulty. 2. Seizure disorder: Continue Keppra per outpatient regimen. 3.DVT prophylaxis: SCDs 4. GI prophylaxis: none as the patient is a critically ill The patient is a full code. Time spent on admission was inpatient care approximately 35 minutes  Harrie Foreman 09/26/2015, 5:57 AM

## 2015-09-26 NOTE — Discharge Instructions (Signed)
Call ENT office if you experience any issues with your recent procedure

## 2015-09-26 NOTE — Discharge Summary (Signed)
[facility] - [place] at [facility]   PATIENT NAME: Daniel Goodman    MR#:  [id number]  DATE OF BIRTH:  Mar 14, 1981  DATE OF ADMISSION:  09/26/2015 ADMITTING PHYSICIAN: [name], MD  DATE OF DISCHARGE: 09/25/14  PRIMARY CARE PHYSICIAN: No PCP Per Patient    ADMISSION DIAGNOSIS:  Peritonsillar abscess [J36]  DISCHARGE DIAGNOSIS:   Left large peritonsillar abscess status post I&D by Dr.Vaught Elevated blood pressure without diagnosis of hypertension SECONDARY DIAGNOSIS:   Past Medical History  Diagnosis Date  . Smoker     HOSPITAL COURSE:  34 year old F American male admitted for peritonsillar abscess. 1. left large Peritonsillar abscess: Drained without difficulty in the ER by ENT  The patient does not have signs or symptoms of sepsis.  -Patient hemodialysis stable no fever. He is able to tolerate by mouth diet. Changed to by mouth Augmentin and complete a prednisone taper He'll follow-up with Dr.vaught in a week to 10 days. -2. Seizure disorder: Continue Keppra per outpatient regimen. 3.DVT prophylaxis: SCDs 4. Elevated blood pressure without diagnosis of hypertension This could be in the setting of pain from the peritonsillar abscess. Patient is asked to keep outpatient blood pressure if remains elevated discussed with primary care physician to start antihypertensives.  Patient is medically stable for discharge.  CONSULTS OBTAINED:    ENT doctor vaught  DRUG ALLERGIES:  No Known Allergies  DISCHARGE MEDICATIONS:   Current Discharge Medication List    START taking these medications   Details  amoxicillin-clavulanate (AUGMENTIN) 875-125 MG tablet Take 1 tablet by mouth 2 (two) times daily. Qty: 20 tablet, Refills: 0    predniSONE (DELTASONE) 10 MG tablet Start with 50 mg daily and taper by 10 mg daily then stop Qty: 15 tablet, Refills: 0      CONTINUE these medications which have NOT CHANGED   Details   HYDROcodone-acetaminophen (NORCO/VICODIN) 5-325 MG tablet Take 1-2 tablets by mouth every 6 (six) hours as needed.    levETIRAcetam (KEPPRA) 1000 MG tablet Take 1 tablet (1,000 mg total) by mouth 2 (two) times daily. Qty: 60 tablet, Refills: 0      STOP taking these medications     clindamycin (CLEOCIN) 150 MG capsule      docusate sodium 100 MG CAPS      folic acid (FOLVITE) 1 MG tablet      methocarbamol (ROBAXIN) 750 MG tablet      Multiple Vitamin (MULTIVITAMIN WITH MINERALS) TABS tablet      naproxen (NAPROSYN) 500 MG tablet      polyethylene glycol (MIRALAX / GLYCOLAX) packet      thiamine 100 MG tablet         If you experience worsening of your admission symptoms, develop shortness of breath, life threatening emergency, suicidal or homicidal thoughts you must seek medical attention immediately by calling 911 or calling your MD immediately  if symptoms less severe.  You Must read complete instructions/literature along with all the possible adverse reactions/side effects for all the Medicines you take and that have been prescribed to you. Take any new Medicines after you have completely understood and accept all the possible adverse reactions/side effects.   Please note  You were cared for by a hospitalist during your hospital stay. If you have any questions about your discharge medications or the care you received while you were in the hospital after you are discharged, you can call the unit and asked to speak with the hospitalist on call if  the hospitalist that took care of you is not available. Once you are discharged, your primary care physician will handle any further medical issues. Please note that NO REFILLS for any discharge medications will be authorized once you are discharged, as it is imperative that you return to your primary care physician (or establish a relationship with a primary care physician if you do not have one) for your aftercare needs so that they  can reassess your need for medications and monitor your lab values. Today   SUBJECTIVE   I feel great  VITAL SIGNS:  Blood pressure 142/93, pulse 80, temperature 98.7 F (37.1 C), temperature source Oral, resp. rate 18, height 6\' 1"  (1.854 m), weight 94.802 kg (209 lb), SpO2 97 %.  I/O:   Intake/Output Summary (Last 24 hours) at 09/26/15 1158 Last data filed at 09/26/15 0945  Gross per 24 hour  Intake   1050 ml  Output      0 ml  Net   1050 ml    PHYSICAL EXAMINATION:  GENERAL:  34 y.o.-year-old patient lying in the bed with no acute distress.  EYES: Pupils equal, round, reactive to light and accommodation. No scleral icterus. Extraocular muscles intact.  HEENT: Head atraumatic, normocephalic. Oropharynx and nasopharynx clear.  NECK:  Supple, no jugular venous distention. No thyroid enlargement, no tenderness.  LUNGS: Normal breath sounds bilaterally, no wheezing, rales,rhonchi or crepitation. No use of accessory muscles of respiration.  CARDIOVASCULAR: S1, S2 normal. No murmurs, rubs, or gallops.  ABDOMEN: Soft, non-tender, non-distended. Bowel sounds present. No organomegaly or mass.  EXTREMITIES: No pedal edema, cyanosis, or clubbing.  NEUROLOGIC: Cranial nerves II through XII are intact. Muscle strength 5/5 in all extremities. Sensation intact. Gait not checked.  PSYCHIATRIC: The patient is alert and oriented x 3.  SKIN: No obvious rash, lesion, or ulcer.   DATA REVIEW:   CBC   Recent Labs Lab 09/26/15 0045  WBC 19.5*  HGB 14.5  HCT 43.7  PLT 403    Chemistries   Recent Labs Lab 09/26/15 0045  NA 135  K 3.6  CL 98*  CO2 26  GLUCOSE 95  BUN 14  CREATININE 1.13  CALCIUM 9.0  AST 12*  ALT 13*  ALKPHOS 65  BILITOT 0.3    Microbiology Results   No results found for this or any previous visit (from the past 240 hour(s)).  RADIOLOGY:  Ct Soft Tissue Neck W Contrast  09/26/2015  CLINICAL DATA:  Diagnosed with tonsillar abscess November 13th, not  feeling better. Difficulty breathing, multiple voice. EXAM: CT NECK WITH CONTRAST TECHNIQUE: Multidetector CT imaging of the neck was performed using the standard protocol following the bolus administration of intravenous contrast. CONTRAST:  75mL OMNIPAQUE IOHEXOL 300 MG/ML  SOLN COMPARISON:  CT head July 26, 2014 FINDINGS: Pharynx and larynx: LEFT palatine tonsil enlargement with bilobed 3.5 x 2.7 x 1.3 cm LEFT peritonsillar abscess. Effacement LEFT parapharyngeal fat tissue planes. Moderate to severely effaced airway at the level of the hypopharynx. Mild edema within LEFT base of tongue extending to the glossoepiglottic fold. Epiglottis is not enlarged. Normal appearance of the larynx. Salivary glands: Normal. Thyroid: Normal. Lymph nodes: Lymphadenopathy measuring up to 2 cm short access LEFT level IIa. Vascular: Normal. Limited intracranial: Normal. Visualized orbits: Normal. Mastoids and visualized paranasal sinuses: Lobulated soft tissue partially imaged LEFT sphenoid sinus. Mastoid air cells are well aerated. Skeleton: Normal. Upper chest: Well-aerated. IMPRESSION: LEFT palatine tonsillitis with superimposed 3.5 x 2.7 x 1.3 cm LEFT tonsillar  abscess, resulting in moderate to severe airway effacement. LEFT neck lymphadenopathy is likely reactive. Similar LEFT sphenoid sinusitis. Acute findings discussed with and reconfirmed by [facility] BROWN on 09/26/2015 at 2:16 am. Electronically Signed   By: Awilda Metro M.D.   On: 09/26/2015 02:17     Management plans discussed with the patient, family and they are in agreement.  CODE STATUS:     Code Status Orders        Start     Ordered   09/26/15 0820  Full code   Continuous     09/26/15 0819      TOTAL TIME TAKING CARE OF THIS PATIENT: 40 minutes.    Morgaine Kimball M.D on 09/26/2015 at 11:58 AM  Between 7am to 6pm - Pager - [phone] After 6pm go to [email or web address] - password [email or web address] [facility]  [facility]  Office   [phone]  CC: Primary care physician; No PCP Per Patient

## 2015-11-25 ENCOUNTER — Emergency Department: Payer: Medicaid Other

## 2015-11-25 ENCOUNTER — Encounter: Payer: Self-pay | Admitting: Emergency Medicine

## 2015-11-25 ENCOUNTER — Emergency Department
Admission: EM | Admit: 2015-11-25 | Discharge: 2015-11-25 | Disposition: A | Discharge status: Home / Self Care | Payer: Medicaid Other | Attending: Emergency Medicine | Admitting: Emergency Medicine

## 2015-11-25 DIAGNOSIS — S6991XA Unspecified injury of right wrist, hand and finger(s), initial encounter: Secondary | ICD-10-CM | POA: Diagnosis present

## 2015-11-25 DIAGNOSIS — S61411A Laceration without foreign body of right hand, initial encounter: Secondary | ICD-10-CM | POA: Insufficient documentation

## 2015-11-25 DIAGNOSIS — Y998 Other external cause status: Secondary | ICD-10-CM | POA: Diagnosis not present

## 2015-11-25 DIAGNOSIS — Y9389 Activity, other specified: Secondary | ICD-10-CM | POA: Insufficient documentation

## 2015-11-25 DIAGNOSIS — Z23 Encounter for immunization: Secondary | ICD-10-CM | POA: Insufficient documentation

## 2015-11-25 DIAGNOSIS — W25XXXA Contact with sharp glass, initial encounter: Secondary | ICD-10-CM | POA: Diagnosis not present

## 2015-11-25 DIAGNOSIS — M79641 Pain in right hand: Secondary | ICD-10-CM

## 2015-11-25 DIAGNOSIS — F17200 Nicotine dependence, unspecified, uncomplicated: Secondary | ICD-10-CM | POA: Diagnosis not present

## 2015-11-25 DIAGNOSIS — Y9289 Other specified places as the place of occurrence of the external cause: Secondary | ICD-10-CM | POA: Diagnosis not present

## 2015-11-25 MED ORDER — TETANUS-DIPHTH-ACELL PERTUSSIS 5-2.5-18.5 LF-MCG/0.5 IM SUSP
0.5000 mL | Freq: Once | INTRAMUSCULAR | Status: AC
  Administered 2015-11-25: 0.5 mL via INTRAMUSCULAR
  Filled 2015-11-25: qty 0.5

## 2015-11-25 MED ORDER — CEPHALEXIN 500 MG PO CAPS: 500.0000 mg | ORAL_CAPSULE | Freq: Four times a day (QID) | ORAL | Status: AC
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
## 2015-11-25 NOTE — ED Notes (Signed)
Having pain to right hand   Skin tear and superficial laceration noted to lateral hand
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
## 2022-12-05 LAB — BASIC METABOLIC PANEL
BUN: 9 (ref 4–21)
CO2: 22 (ref 13–22)
Chloride: 105 (ref 99–108)
Creatinine: 1 (ref 0.6–1.3)
Glucose: 82
Potassium: 4.1 mEq/L (ref 3.5–5.1)
Sodium: 134 — AB (ref 137–147)

## 2022-12-05 LAB — CBC AND DIFFERENTIAL
HCT: 44 (ref 41–53)
Hemoglobin: 14.8 (ref 13.5–17.5)
Platelets: 328 10*3/uL (ref 150–400)
WBC: 8

## 2022-12-05 LAB — COMPREHENSIVE METABOLIC PANEL
Albumin: 4.2 (ref 3.5–5.0)
Calcium: 9.9 (ref 8.7–10.7)
eGFR: >90

## 2022-12-05 LAB — HEPATIC FUNCTION PANEL
ALT: 27 U/L (ref 10–40)
AST: 30 (ref 14–40)
Alkaline Phosphatase: 92 (ref 25–125)
Bilirubin, Total: 0.9

## 2022-12-05 LAB — CBC: RBC: 5.36 — AB (ref 3.87–5.11)

## 2023-01-25 ENCOUNTER — Encounter: Payer: Self-pay | Admitting: Physician Assistant

## 2023-01-25 ENCOUNTER — Ambulatory Visit (INDEPENDENT_AMBULATORY_CARE_PROVIDER_SITE_OTHER): Payer: 59 | Admitting: Physician Assistant

## 2023-01-25 VITALS — BP 142/98 | HR 79 | Temp 98.5°F | Ht 73.0 in | Wt 224.0 lb

## 2023-01-25 DIAGNOSIS — I10 Essential (primary) hypertension: Secondary | ICD-10-CM | POA: Insufficient documentation

## 2023-01-25 DIAGNOSIS — R109 Unspecified abdominal pain: Secondary | ICD-10-CM

## 2023-01-25 DIAGNOSIS — K921 Melena: Secondary | ICD-10-CM | POA: Insufficient documentation

## 2023-01-25 DIAGNOSIS — R3129 Other microscopic hematuria: Secondary | ICD-10-CM

## 2023-01-25 LAB — POCT URINALYSIS DIPSTICK
Bilirubin, UA: NEGATIVE
Glucose, UA: NEGATIVE
Ketones, UA: NEGATIVE
Leukocytes, UA: NEGATIVE
Nitrite, UA: NEGATIVE
Protein, UA: POSITIVE — AB
Spec Grav, UA: 1.025 (ref 1.010–1.025)
Urobilinogen, UA: 0.2 E.U./dL
pH, UA: 5 (ref 5.0–8.0)

## 2023-01-25 MED ORDER — TAMSULOSIN HCL 0.4 MG PO CAPS: 0.4000 mg | ORAL_CAPSULE | Freq: Every day | ORAL | 0 refills | Status: DC

## 2023-01-25 NOTE — Patient Instructions (Signed)
-  I think you have a kidney stone on the LEFT.  -Take the medicine once per day and drink lots of fluid.  -If things get much worse especially if fever, go to ED -Come back in a week and we will check on blood pressure
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
## 2023-02-01 ENCOUNTER — Encounter: Payer: Self-pay | Admitting: Physician Assistant

## 2023-02-01 ENCOUNTER — Ambulatory Visit (INDEPENDENT_AMBULATORY_CARE_PROVIDER_SITE_OTHER): Payer: 59 | Admitting: Physician Assistant

## 2023-02-01 VITALS — BP 128/80 | HR 80 | Ht 73.0 in | Wt 225.0 lb

## 2023-02-01 DIAGNOSIS — R1012 Left upper quadrant pain: Secondary | ICD-10-CM

## 2023-02-01 DIAGNOSIS — R21 Rash and other nonspecific skin eruption: Secondary | ICD-10-CM

## 2023-02-01 DIAGNOSIS — K921 Melena: Secondary | ICD-10-CM

## 2023-02-01 DIAGNOSIS — I10 Essential (primary) hypertension: Secondary | ICD-10-CM

## 2023-02-01 DIAGNOSIS — R3129 Other microscopic hematuria: Secondary | ICD-10-CM

## 2023-02-01 LAB — POCT URINALYSIS DIPSTICK
Bilirubin, UA: NEGATIVE
Glucose, UA: NEGATIVE
Ketones, UA: NEGATIVE
Leukocytes, UA: NEGATIVE
Nitrite, UA: NEGATIVE
Protein, UA: POSITIVE — AB
Spec Grav, UA: 1.025 (ref 1.010–1.025)
Urobilinogen, UA: 0.2 E.U./dL
pH, UA: 5 (ref 5.0–8.0)

## 2023-02-01 LAB — POC HEMOCCULT BLD/STL (OFFICE/1-CARD/DIAGNOSTIC): Fecal Occult Blood, POC: NEGATIVE
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
## 2023-02-04 ENCOUNTER — Ambulatory Visit
Admission: RE | Admit: 2023-02-04 | Discharge: 2023-02-04 | Disposition: A | Discharge status: Home / Self Care | Payer: 59 | Source: Ambulatory Visit | Attending: Physician Assistant | Admitting: Physician Assistant

## 2023-02-04 DIAGNOSIS — R3129 Other microscopic hematuria: Secondary | ICD-10-CM | POA: Diagnosis present

## 2023-02-04 DIAGNOSIS — R1012 Left upper quadrant pain: Secondary | ICD-10-CM | POA: Diagnosis present

## 2023-02-04 DIAGNOSIS — K921 Melena: Secondary | ICD-10-CM | POA: Insufficient documentation

## 2023-02-15 ENCOUNTER — Ambulatory Visit
Admission: RE | Admit: 2023-02-15 | Discharge: 2023-02-15 | Disposition: A | Discharge status: Home / Self Care | Payer: 59 | Source: Ambulatory Visit | Attending: Physician Assistant | Admitting: Physician Assistant

## 2023-02-15 VITALS — BP 145/93 | HR 74 | Temp 98.3°F | Resp 18

## 2023-02-15 DIAGNOSIS — Z202 Contact with and (suspected) exposure to infections with a predominantly sexual mode of transmission: Secondary | ICD-10-CM | POA: Insufficient documentation

## 2023-02-15 DIAGNOSIS — Z113 Encounter for screening for infections with a predominantly sexual mode of transmission: Secondary | ICD-10-CM | POA: Insufficient documentation

## 2023-02-15 MED ORDER — METRONIDAZOLE 500 MG PO TABS: 2000.0000 mg | ORAL_TABLET | Freq: Once | ORAL | 0 refills | Status: AC

## 2023-02-15 NOTE — Discharge Instructions (Signed)
-  Testing for trichomonas, gonorrhea and chlamydia.  Results we back in a day or 2. - I am going to go ahead and treat you for trichomonas at this time given your exposure. - If you see results not any questions before we contact you or you may contact us. - No sexual intercourse until 1 week after you and your partner have completed treatment.
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
## 2023-02-15 NOTE — ED Triage Notes (Signed)
Pt states his partner states she was positive for Trich a few weeks ago. Pt denies any sx's.

## 2023-02-16 LAB — CYTOLOGY, (ORAL, ANAL, URETHRAL) ANCILLARY ONLY
Chlamydia: NEGATIVE
Comment: NEGATIVE
Comment: NEGATIVE
Comment: NORMAL
Neisseria Gonorrhea: NEGATIVE
Trichomonas: NEGATIVE

## 2023-03-16 ENCOUNTER — Ambulatory Visit
Admission: RE | Admit: 2023-03-16 | Discharge: 2023-03-16 | Disposition: A | Discharge status: Home / Self Care | Payer: 59 | Source: Ambulatory Visit | Attending: Family Medicine | Admitting: Family Medicine

## 2023-03-16 ENCOUNTER — Ambulatory Visit (INDEPENDENT_AMBULATORY_CARE_PROVIDER_SITE_OTHER): Payer: 59

## 2023-03-16 VITALS — BP 156/90 | HR 71 | Temp 98.2°F

## 2023-03-16 DIAGNOSIS — M79672 Pain in left foot: Secondary | ICD-10-CM

## 2023-03-16 DIAGNOSIS — M2012 Hallux valgus (acquired), left foot: Secondary | ICD-10-CM

## 2023-03-16 MED ORDER — NAPROXEN 500 MG PO TABS: 500.0000 mg | ORAL_TABLET | Freq: Two times a day (BID) | ORAL | 0 refills | Status: DC

## 2023-03-16 MED ORDER — KETOROLAC TROMETHAMINE 60 MG/2ML IM SOLN
30.0000 mg | Freq: Once | INTRAMUSCULAR | Status: AC
  Administered 2023-03-16: 30 mg via INTRAMUSCULAR

## 2023-03-16 NOTE — ED Triage Notes (Signed)
Pt c/o LT foot pain x1 month, denies any fall or injury.

## 2023-03-16 NOTE — Discharge Instructions (Addendum)
You have a old healed fracture in your foot.   [facility] at [facility]. [place],  [place]  [address] Main: [phone]
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
## 2023-04-06 ENCOUNTER — Ambulatory Visit: Payer: 59 | Admitting: Physician Assistant

## 2023-05-30 ENCOUNTER — Telehealth: Payer: Self-pay

## 2023-05-30 NOTE — Telephone Encounter (Signed)
Please call pt to reschedule missed appointment.  KP

## 2023-05-30 NOTE — Telephone Encounter (Signed)
Noted  KP 

## 2023-05-30 NOTE — Transitions of Care (Post Inpatient/ED Visit) (Signed)
   05/30/2023  Name: Daniel Goodman MRN: [id number] DOB: 1980/12/10  Today's TOC FU Call Status:    Transition Care Management Follow-up Telephone Call Date of Discharge: 05/28/23 Discharge Facility: Other ([facility]) Name of Other ([facility]) Discharge Facility: [facility] Type of Discharge: Emergency Department Reason for ED Visit: Other: (seizures) How have you been since you were released from the hospital?: Better Any questions or concerns?: No  Items Reviewed: Did you receive and understand the discharge instructions provided?: Yes Medications obtained,verified, and reconciled?: Yes (Medications Reviewed) Any new allergies since your discharge?: No Dietary orders reviewed?: No Do you have support at home?: Yes  Medications Reviewed Today: Medications Reviewed Today     Reviewed by Dorna Bloom, CMA (Certified Medical Assistant) on 03/16/23 at 1348  Med List Status: <None>   Medication Order Taking? Sig Documenting Provider Last Dose Status Informant  ibuprofen (ADVIL) 600 MG tablet [id number]  Take by mouth. [provider]  Active   omeprazole (PRILOSEC) 20 MG capsule [id number]  Take 1 capsule by mouth daily. [provider]  Expired 03/05/23 2359   tamsulosin (FLOMAX) 0.4 MG CAPS capsule [id number]  Take 1 capsule (0.4 mg total) by mouth daily. Remo Lipps, PA  Active   tiZANidine (ZANAFLEX) 2 MG tablet [id number]  Take by mouth. [provider]  Active             Home Care and Equipment/Supplies: Were Home Health Services Ordered?: No Any new equipment or medical supplies ordered?: No  Functional Questionnaire: Do you need assistance with bathing/showering or dressing?: No Do you need assistance with meal preparation?: No Do you need assistance with eating?: No Do you have difficulty maintaining continence: No Do you need assistance with getting out of bed/getting out of a chair/moving?: No Do  you have difficulty managing or taking your medications?: No  Follow up appointments reviewed: PCP Follow-up appointment confirmed?: Yes Specialist Hospital Follow-up appointment confirmed?: No Do you need transportation to your follow-up appointment?: No Do you understand care options if your condition(s) worsen?: Yes-patient verbalized understanding    SIGNATURE Mariann Barter Alonni Heimsoth, CMA

## 2023-05-31 ENCOUNTER — Ambulatory Visit: Payer: 59 | Admitting: Gastroenterology

## 2023-08-24 ENCOUNTER — Ambulatory Visit: Payer: 59 | Admitting: Physician Assistant

## 2024-07-03 ENCOUNTER — Ambulatory Visit: Payer: Self-pay | Admitting: Emergency Medicine

## 2024-07-03 ENCOUNTER — Ambulatory Visit (INDEPENDENT_AMBULATORY_CARE_PROVIDER_SITE_OTHER): Payer: Self-pay

## 2024-07-03 ENCOUNTER — Ambulatory Visit
Admission: EM | Admit: 2024-07-03 | Discharge: 2024-07-03 | Disposition: A | Discharge status: Home / Self Care | Payer: PRIVATE HEALTH INSURANCE | Attending: Emergency Medicine | Admitting: Emergency Medicine

## 2024-07-03 DIAGNOSIS — R051 Acute cough: Secondary | ICD-10-CM | POA: Insufficient documentation

## 2024-07-03 DIAGNOSIS — Z20822 Contact with and (suspected) exposure to covid-19: Secondary | ICD-10-CM | POA: Insufficient documentation

## 2024-07-03 DIAGNOSIS — J069 Acute upper respiratory infection, unspecified: Secondary | ICD-10-CM

## 2024-07-03 DIAGNOSIS — R7989 Other specified abnormal findings of blood chemistry: Secondary | ICD-10-CM | POA: Insufficient documentation

## 2024-07-03 LAB — BASIC METABOLIC PANEL WITH GFR
Anion gap: 9 (ref 5–15)
BUN: 23 mg/dL — ABNORMAL HIGH (ref 6–20)
CO2: 25 mmol/L (ref 22–32)
Calcium: 9.1 mg/dL (ref 8.9–10.3)
Chloride: 101 mmol/L (ref 98–111)
Creatinine, Ser: 1.77 mg/dL — ABNORMAL HIGH (ref 0.61–1.24)
GFR, Estimated: 48 mL/min — ABNORMAL LOW (ref >60)
Glucose, Bld: 111 mg/dL — ABNORMAL HIGH (ref 70–99)
Potassium: 4.4 mmol/L (ref 3.5–5.1)
Sodium: 135 mmol/L (ref 135–145)

## 2024-07-03 LAB — RESP PANEL BY RT-PCR (FLU A&B, COVID) ARPGX2
Influenza A by PCR: NEGATIVE
Influenza B by PCR: NEGATIVE
SARS Coronavirus 2 by RT PCR: NEGATIVE

## 2024-07-03 MED ORDER — IPRATROPIUM BROMIDE 0.06 % NA SOLN: 2.0000 | Freq: Four times a day (QID) | NASAL | 0 refills | Status: AC

## 2024-07-03 MED ORDER — IBUPROFEN 600 MG PO TABS: 600.0000 mg | ORAL_TABLET | Freq: Four times a day (QID) | ORAL | 0 refills | Status: DC | PRN

## 2024-07-03 MED ORDER — PROMETHAZINE-DM 6.25-15 MG/5ML PO SYRP: 5.0000 mL | ORAL_SOLUTION | Freq: Four times a day (QID) | ORAL | 0 refills | Status: DC | PRN

## 2024-07-03 MED ORDER — ACETAMINOPHEN 325 MG PO TABS
975.0000 mg | ORAL_TABLET | Freq: Once | ORAL | Status: AC
  Administered 2024-07-03: 975 mg via ORAL

## 2024-07-03 MED ORDER — AEROCHAMBER MV MISC: 1 refills | Status: DC

## 2024-07-03 MED ORDER — ALBUTEROL SULFATE HFA 108 (90 BASE) MCG/ACT IN AERS: 1.0000 | INHALATION_SPRAY | RESPIRATORY_TRACT | 0 refills | Status: AC | PRN

## 2024-07-03 NOTE — Discharge Instructions (Addendum)
 I did not appreciate any obvious pneumonia on your x-ray, however, the formal radiology read is pending.  We will contact you if radiology sees something that I did not see and we need to change management.    We will contact you if your COVID or flu are positive and we will prescribe the appropriate antiviral.  In the meantime, Atrovent  nasal spray, Mucinex, saline nasal irrigation, 2 puffs from your albuterol  inhaler with a spacer every 4-6 hours, promethazine  DM as needed for cough.  600 mg of ibuprofen , 1000 mg of Tylenol  3-4 times a day as needed for pain.  Follow-up your primary care provider if not getting better in 4 to 5 days.  At the ER if you get worse or for any other concerns

## 2024-07-03 NOTE — ED Triage Notes (Signed)
 Sx x 3 days  Nasal congestion Cough/ non productive Headache

## 2024-07-03 NOTE — Progress Notes (Signed)
 No acute cardiopulmonary disease consistent with my read per radiology.  COVID, flu negative.

## 2024-07-03 NOTE — ED Provider Notes (Signed)
 HPI  SUBJECTIVE:  Daniel Goodman is a 43 y.o. male who presents with 3 to 4 days of nasal congestion, mucoid rhinorrhea, headaches, sinus pain and pressure, dry cough, shortness of breath, dyspnea on exertion.  No fevers, body aches, facial swelling, upper dental pain, postnasal drip, sore throat, wheezing, nausea, vomiting, diarrhea, abdominal pain.  No known COVID or flu exposure.  He did not get the COVID or flu vaccines.  He is able to sleep through the night without waking up coughing.  No antibiotics in the past 3 months.  No antipyretic in the past 6 hours.  He has been taking Benadryl and Mucinex without improvement in his symptoms.  No aggravating factors. He has a past medical history of hypertension, seizures, GERD and is a smoker.  PCP: [facility].   Past Medical History:  Diagnosis Date   GERD (gastroesophageal reflux disease)    Hypertension    Seizures (HCC)    Smoker     History reviewed. No pertinent surgical history.  Family History  Problem Relation Age of Onset   Cancer Mother    Hypertension Father    Diabetes Father     Social History   Tobacco Use   Smoking status: Every Day    Types: Cigars  Vaping Use   Vaping status: Never Used  Substance Use Topics   Alcohol use: Yes    Comment: occasionally   Drug use: Yes    No current facility-administered medications for this encounter.  Current Outpatient Medications:    albuterol  (VENTOLIN  HFA) 108 (90 Base) MCG/ACT inhaler, Inhale 1-2 puffs into the lungs every 4 (four) hours as needed for wheezing or shortness of breath., Disp: 1 each, Rfl: 0   ipratropium (ATROVENT ) 0.06 % nasal spray, Place 2 sprays into both nostrils 4 (four) times daily., Disp: 15 mL, Rfl: 0   promethazine -dextromethorphan (PROMETHAZINE -DM) 6.25-15 MG/5ML syrup, Take 5 mLs by mouth 4 (four) times daily as needed for cough., Disp: 118 mL, Rfl: 0   Spacer/Aero-Holding Chambers (AEROCHAMBER MV) inhaler, Use as instructed,  Disp: 1 each, Rfl: 1   omeprazole (PRILOSEC) 20 MG capsule, Take 1 capsule by mouth daily., Disp: , Rfl:    tamsulosin  (FLOMAX ) 0.4 MG CAPS capsule, Take 1 capsule (0.4 mg total) by mouth daily., Disp: 30 capsule, Rfl: 0  No Known Allergies   ROS  As noted in HPI.   Physical Exam  BP (!) 165/109 (BP Location: Left Arm)   Pulse 73   Temp 98.4 F (36.9 C) (Oral)   Resp 17   Wt 106.1 kg   SpO2 98%   BMI 30.87 kg/m  BP Readings from Last 3 Encounters:  07/03/24 (!) 165/109  03/16/23 (!) 156/90  02/15/23 (!) 145/93    Constitutional: Well developed, well nourished, no acute distress Eyes: PERRL, EOMI, conjunctiva normal bilaterally HENT: Normocephalic, atraumatic,mucus membranes moist.  Extensive clear nasal congestion.  Erythematous, swollen turbinates.  No maxillary, frontal sinus tenderness.  Unable to completely visualize oropharynx.  Uvula midline. Neck: Positive cervical lymphadenopathy Respiratory: Clear to auscultation bilaterally, no rales, no wheezing, no rhonchi.  Fair air movement.  No anterior, lateral chest wall tenderness Cardiovascular: Normal rate and rhythm, no murmurs, no gallops, no rubs GI: nondistended skin: No rash, skin intact Musculoskeletal: no deformities Neurologic: Alert & oriented x 3, CN III-XII grossly intact, no motor deficits, sensation grossly intact Psychiatric: Speech and behavior appropriate   ED Course   Medications  acetaminophen  (TYLENOL ) tablet 975 mg (975  mg Oral Given 07/03/24 1613)    Orders Placed This Encounter  Procedures   Resp Panel by RT-PCR (Flu A&B, Covid) Anterior Nasal Swab    Standing Status:   Standing    Number of Occurrences:   1    Patient immune status:   Normal    Release to patient:   Immediate [1]   DG Chest 2 View    Standing Status:   Standing    Number of Occurrences:   1    Reason for Exam (SYMPTOM  OR DIAGNOSIS REQUIRED):   Cough, shortness of breath and dyspnea on exertion for 3 to 4 days rule out  pneumonia   Basic metabolic panel    Standing Status:   Standing    Number of Occurrences:   1   No results found for this or any previous visit (from the past 24 hours). No results found.  ED Clinical Impression  1. Viral URI with cough   2. Acute cough   3. Encounter for laboratory testing for COVID-19 virus      ED Assessment/Plan   {The patient has been seen in Urgent Care in the last 3 years. :1} Checking COVID, flu.  Last kidney function was in 2024.  Will check BMP today.  Also chest x-ray.  Reviewed imaging independently.  No pneumonia as read by me.  Formal radiology overread pending.  Will call Alan, his fiance at [phone] if radiology overread differs from mine and we need to change management.  Reviewed radiology report.  *** No pneumonia consistent with my read. See radiology report for full details.  Home with Atrovent  nasal spray, Mucinex, saline nasal medication, Promethazine  DM.  2 puffs from albuterol  inhaler with spacer every 4-6 hours as needed due to the shortness of breath and dyspnea on exertion, history of smoking.  Work note.  Discussed labs, imaging, MDM, treatment plan, and plan for follow-up with patient Discussed sn/sx that should prompt return to the ED. patient agrees with plan.   Meds ordered this encounter  Medications   acetaminophen  (TYLENOL ) tablet 975 mg   albuterol  (VENTOLIN  HFA) 108 (90 Base) MCG/ACT inhaler    Sig: Inhale 1-2 puffs into the lungs every 4 (four) hours as needed for wheezing or shortness of breath.    Dispense:  1 each    Refill:  0   promethazine -dextromethorphan (PROMETHAZINE -DM) 6.25-15 MG/5ML syrup    Sig: Take 5 mLs by mouth 4 (four) times daily as needed for cough.    Dispense:  118 mL    Refill:  0   Spacer/Aero-Holding Chambers (AEROCHAMBER MV) inhaler    Sig: Use as instructed    Dispense:  1 each    Refill:  1   ipratropium (ATROVENT ) 0.06 % nasal spray    Sig: Place 2 sprays into both nostrils 4  (four) times daily.    Dispense:  15 mL    Refill:  0      *This clinic note was created using Scientist, clinical (histocompatibility and immunogenetics). Therefore, there may be occasional mistakes despite careful proofreading. ?

## 2024-08-13 ENCOUNTER — Emergency Department: Payer: Self-pay

## 2024-08-13 ENCOUNTER — Observation Stay
Admission: EM | Admit: 2024-08-13 | Discharge: 2024-08-14 | Disposition: A | Discharge status: Home / Self Care | Payer: Self-pay | Attending: Internal Medicine | Admitting: Internal Medicine

## 2024-08-13 ENCOUNTER — Other Ambulatory Visit: Payer: Self-pay

## 2024-08-13 ENCOUNTER — Observation Stay: Payer: Self-pay

## 2024-08-13 DIAGNOSIS — F1210 Cannabis abuse, uncomplicated: Secondary | ICD-10-CM | POA: Insufficient documentation

## 2024-08-13 DIAGNOSIS — F1090 Alcohol use, unspecified, uncomplicated: Secondary | ICD-10-CM | POA: Insufficient documentation

## 2024-08-13 DIAGNOSIS — I10 Essential (primary) hypertension: Secondary | ICD-10-CM

## 2024-08-13 DIAGNOSIS — F1411 Cocaine abuse, in remission: Secondary | ICD-10-CM

## 2024-08-13 DIAGNOSIS — Z79899 Other long term (current) drug therapy: Secondary | ICD-10-CM | POA: Insufficient documentation

## 2024-08-13 DIAGNOSIS — Z8669 Personal history of other diseases of the nervous system and sense organs: Secondary | ICD-10-CM | POA: Insufficient documentation

## 2024-08-13 DIAGNOSIS — Z8673 Personal history of transient ischemic attack (TIA), and cerebral infarction without residual deficits: Secondary | ICD-10-CM

## 2024-08-13 DIAGNOSIS — I6389 Other cerebral infarction: Principal | ICD-10-CM | POA: Insufficient documentation

## 2024-08-13 DIAGNOSIS — F17210 Nicotine dependence, cigarettes, uncomplicated: Secondary | ICD-10-CM | POA: Insufficient documentation

## 2024-08-13 DIAGNOSIS — I63312 Cerebral infarction due to thrombosis of left middle cerebral artery: Secondary | ICD-10-CM

## 2024-08-13 DIAGNOSIS — R200 Anesthesia of skin: Principal | ICD-10-CM

## 2024-08-13 DIAGNOSIS — I6381 Other cerebral infarction due to occlusion or stenosis of small artery: Secondary | ICD-10-CM

## 2024-08-13 DIAGNOSIS — I639 Cerebral infarction, unspecified: Secondary | ICD-10-CM | POA: Diagnosis present

## 2024-08-13 DIAGNOSIS — F1410 Cocaine abuse, uncomplicated: Secondary | ICD-10-CM

## 2024-08-13 DIAGNOSIS — Z7982 Long term (current) use of aspirin: Secondary | ICD-10-CM | POA: Insufficient documentation

## 2024-08-13 LAB — CBC WITH DIFFERENTIAL/PLATELET
Abs Immature Granulocytes: 0.03 K/uL (ref 0.00–0.07)
Basophils Absolute: 0 K/uL (ref 0.0–0.1)
Basophils Relative: 1 %
Eosinophils Absolute: 0.8 K/uL — ABNORMAL HIGH (ref 0.0–0.5)
Eosinophils Relative: 11 %
HCT: 45.6 % (ref 39.0–52.0)
Hemoglobin: 14.7 g/dL (ref 13.0–17.0)
Immature Granulocytes: 0 %
Lymphocytes Relative: 28 %
Lymphs Abs: 2.1 K/uL (ref 0.7–4.0)
MCH: 27.5 pg (ref 26.0–34.0)
MCHC: 32.2 g/dL (ref 30.0–36.0)
MCV: 85.2 fL (ref 80.0–100.0)
Monocytes Absolute: 0.7 K/uL (ref 0.1–1.0)
Monocytes Relative: 9 %
Neutro Abs: 3.7 K/uL (ref 1.7–7.7)
Neutrophils Relative %: 51 %
Platelets: 315 K/uL (ref 150–400)
RBC: 5.35 MIL/uL (ref 4.22–5.81)
RDW: 14.2 % (ref 11.5–15.5)
WBC: 7.4 K/uL (ref 4.0–10.5)
nRBC: 0 % (ref 0.0–0.2)

## 2024-08-13 LAB — URINE DRUG SCREEN, QUALITATIVE (ARMC ONLY)
Amphetamines, Ur Screen: NOT DETECTED
Barbiturates, Ur Screen: NOT DETECTED
Benzodiazepine, Ur Scrn: NOT DETECTED
Cannabinoid 50 Ng, Ur ~~LOC~~: NOT DETECTED
Cocaine Metabolite,Ur ~~LOC~~: POSITIVE — AB
MDMA (Ecstasy)Ur Screen: NOT DETECTED
Methadone Scn, Ur: NOT DETECTED
Opiate, Ur Screen: NOT DETECTED
Phencyclidine (PCP) Ur S: NOT DETECTED
Tricyclic, Ur Screen: NOT DETECTED

## 2024-08-13 LAB — COMPREHENSIVE METABOLIC PANEL WITH GFR
ALT: 13 U/L (ref 0–44)
AST: 14 U/L — ABNORMAL LOW (ref 15–41)
Albumin: 3.6 g/dL (ref 3.5–5.0)
Alkaline Phosphatase: 70 U/L (ref 38–126)
Anion gap: 12 (ref 5–15)
BUN: 14 mg/dL (ref 6–20)
CO2: 23 mmol/L (ref 22–32)
Calcium: 9 mg/dL (ref 8.9–10.3)
Chloride: 102 mmol/L (ref 98–111)
Creatinine, Ser: 1.41 mg/dL — ABNORMAL HIGH (ref 0.61–1.24)
GFR, Estimated: >60 mL/min (ref >60)
Glucose, Bld: 105 mg/dL — ABNORMAL HIGH (ref 70–99)
Potassium: 3.8 mmol/L (ref 3.5–5.1)
Sodium: 137 mmol/L (ref 135–145)
Total Bilirubin: 0.6 mg/dL (ref 0.0–1.2)
Total Protein: 7.1 g/dL (ref 6.5–8.1)

## 2024-08-13 LAB — HIV ANTIBODY (ROUTINE TESTING W REFLEX): HIV Screen 4th Generation wRfx: NONREACTIVE

## 2024-08-13 LAB — ETHANOL: Alcohol, Ethyl (B): <15 mg/dL (ref <15)

## 2024-08-13 LAB — APTT: aPTT: 27 s (ref 24–36)

## 2024-08-13 LAB — PROTIME-INR
INR: 0.9 (ref 0.8–1.2)
Prothrombin Time: 12.7 s (ref 11.4–15.2)

## 2024-08-13 LAB — HEMOGLOBIN A1C
Hgb A1c MFr Bld: 5.2 % (ref 4.8–5.6)
Mean Plasma Glucose: 102.54 mg/dL

## 2024-08-13 MED ORDER — ENOXAPARIN SODIUM 60 MG/0.6ML IJ SOSY
50.0000 mg | PREFILLED_SYRINGE | INTRAMUSCULAR | Status: DC
  Administered 2024-08-13: 50 mg via SUBCUTANEOUS
  Filled 2024-08-13: qty 0.6

## 2024-08-13 MED ORDER — ACETAMINOPHEN 650 MG RE SUPP: 650.0000 mg | RECTAL | Status: DC | PRN

## 2024-08-13 MED ORDER — HYDRALAZINE HCL 20 MG/ML IJ SOLN
5.0000 mg | Freq: Four times a day (QID) | INTRAMUSCULAR | Status: DC | PRN
  Administered 2024-08-13: 5 mg via INTRAVENOUS
  Filled 2024-08-13: qty 1

## 2024-08-13 MED ORDER — ACETAMINOPHEN 160 MG/5ML PO SOLN: 650.0000 mg | ORAL | Status: DC | PRN

## 2024-08-13 MED ORDER — SENNOSIDES-DOCUSATE SODIUM 8.6-50 MG PO TABS: 1.0000 | ORAL_TABLET | Freq: Every evening | ORAL | Status: DC | PRN

## 2024-08-13 MED ORDER — TAMSULOSIN HCL 0.4 MG PO CAPS
0.4000 mg | ORAL_CAPSULE | Freq: Every day | ORAL | Status: DC
Start: 2024-08-13 — End: 2024-08-13

## 2024-08-13 MED ORDER — PANTOPRAZOLE SODIUM 40 MG PO TBEC
40.0000 mg | DELAYED_RELEASE_TABLET | Freq: Every day | ORAL | Status: DC
Start: 2024-08-13 — End: 2024-08-14
  Administered 2024-08-13 – 2024-08-14 (×2): 40 mg via ORAL
  Filled 2024-08-13 (×2): qty 1

## 2024-08-13 MED ORDER — LORAZEPAM 2 MG/ML IJ SOLN: 0.5000 mg | Freq: Four times a day (QID) | INTRAMUSCULAR | Status: DC | PRN

## 2024-08-13 MED ORDER — ALBUTEROL SULFATE (2.5 MG/3ML) 0.083% IN NEBU: 2.5000 mg | INHALATION_SOLUTION | RESPIRATORY_TRACT | Status: DC | PRN

## 2024-08-13 MED ORDER — IPRATROPIUM BROMIDE 0.06 % NA SOLN: 2.0000 | Freq: Four times a day (QID) | NASAL | Status: DC | PRN

## 2024-08-13 MED ORDER — ASPIRIN 81 MG PO TBEC
81.0000 mg | DELAYED_RELEASE_TABLET | Freq: Every day | ORAL | Status: DC
  Administered 2024-08-13 – 2024-08-14 (×2): 81 mg via ORAL
  Filled 2024-08-13 (×2): qty 1

## 2024-08-13 MED ORDER — STROKE: EARLY STAGES OF RECOVERY BOOK: Freq: Once | Status: AC

## 2024-08-13 MED ORDER — SODIUM CHLORIDE 0.9 % IV SOLN: INTRAVENOUS | Status: DC

## 2024-08-13 MED ORDER — ACETAMINOPHEN 325 MG PO TABS: 650.0000 mg | ORAL_TABLET | ORAL | Status: DC | PRN

## 2024-08-13 MED ORDER — CLOPIDOGREL BISULFATE 75 MG PO TABS
75.0000 mg | ORAL_TABLET | Freq: Every day | ORAL | Status: DC
Start: 2024-08-13 — End: 2024-08-14
  Administered 2024-08-13 – 2024-08-14 (×2): 75 mg via ORAL
  Filled 2024-08-13 (×2): qty 1

## 2024-08-13 NOTE — H&P (Signed)
 History and Physical    Daniel Goodman FMW:[id number] DOB: Nov 14, 1980 DOA: 08/13/2024  PCP: Manya Toribio SQUIBB, PA (Confirm with patient/family/NH records and if not entered, this has to be entered at [facility] point of entry) Patient coming from: Home  I have personally briefly reviewed patient's old medical records in [facility]  Chief Complaint: Right sided numbness  HPI: Daniel Goodman is a 43 y.o. male with medical history significant of cocaine abuse, seizure disorder, HTN not on any BP medications presented with strokelike symptoms.  Patient woke up this morning with new onset of right-sided numbness and tingling sensation involving right-sided face, right-sided lips, both right arm and right lower extremities but denies any weakness of any limbs no speech problems no double visions.  He also complaining about mild global headache but there is no nausea vomiting.  He told me that he has been compliant with seizure medications and the last episode of seizure he had was many years ago.  Patient seems not aware of he should be on any hypertension medications.  ED Course: Afebrile nontachycardic blood pressure 177/127 O2 saturation 98% on room air.  CT head negative for acute findings and CT negative for LVO.  Blood work showed BUN 14 creatinine 1.4, WBC 7.4 hemoglobin 14.2.  Review of Systems: As per HPI otherwise 14 point review of systems negative.    Past Medical History:  Diagnosis Date   GERD (gastroesophageal reflux disease)    Hypertension    Seizures (HCC)    Smoker     History reviewed. No pertinent surgical history.   reports that he has been smoking cigars. He does not have any smokeless tobacco history on file. He reports current alcohol use. He reports current drug use.  No Known Allergies  Family History  Problem Relation Age of Onset   Cancer Mother    Hypertension Father    Diabetes Father     Prior to Admission medications   Medication Sig Start Date  End Date Taking? Authorizing Provider  albuterol  (VENTOLIN  HFA) 108 (90 Base) MCG/ACT inhaler Inhale 1-2 puffs into the lungs every 4 (four) hours as needed for wheezing or shortness of breath. 07/03/24  Yes Van Knee, MD  ibuprofen  (ADVIL ) 600 MG tablet Take 1 tablet (600 mg total) by mouth every 6 (six) hours as needed. Patient not taking: Reported on 08/13/2024 07/03/24   Mortenson, Ashley, MD  ipratropium (ATROVENT ) 0.06 % nasal spray Place 2 sprays into both nostrils 4 (four) times daily. 07/03/24   Van Knee, MD  levETIRAcetam  (KEPPRA ) 1000 MG tablet Take 1,000 mg by mouth 2 (two) times daily. Patient not taking: Reported on 08/13/2024 09/29/23   [provider]  omeprazole (PRILOSEC) 20 MG capsule Take 1 capsule by mouth daily. 12/05/22 03/05/23  [provider]  promethazine -dextromethorphan (PROMETHAZINE -DM) 6.25-15 MG/5ML syrup Take 5 mLs by mouth 4 (four) times daily as needed for cough. Patient not taking: Reported on 08/13/2024 07/03/24   Van Knee, MD  Spacer/Aero-Holding Chambers (AEROCHAMBER MV) inhaler Use as instructed Patient not taking: Reported on 08/13/2024 07/03/24   Van Knee, MD  tamsulosin  (FLOMAX ) 0.4 MG CAPS capsule Take 1 capsule (0.4 mg total) by mouth daily. Patient not taking: Reported on 08/13/2024 01/25/23   Manya Toribio SQUIBB, PA    Physical Exam: Vitals:   08/13/24 0729 08/13/24 0730 08/13/24 0805 08/13/24 0937  BP: (!) 177/127  (!) 167/129 (!) 184/122  Pulse: 96  89   Resp: 16  (!) 21  Temp: 98.9 F (37.2 C)     TempSrc: Oral     SpO2: 99%  94%   Weight:  108 kg    Height:  6' 1 (1.854 m)      Constitutional: NAD, calm, comfortable Vitals:   08/13/24 0729 08/13/24 0730 08/13/24 0805 08/13/24 0937  BP: (!) 177/127  (!) 167/129 (!) 184/122  Pulse: 96  89   Resp: 16  (!) 21   Temp: 98.9 F (37.2 C)     TempSrc: Oral     SpO2: 99%  94%   Weight:  108 kg    Height:  6' 1 (1.854 m)     Eyes: PERRL, lids  and conjunctivae normal ENMT: Mucous membranes are moist. Posterior pharynx clear of any exudate or lesions.Normal dentition.  Neck: normal, supple, no masses, no thyromegaly Respiratory: clear to auscultation bilaterally, no wheezing, no crackles. Normal respiratory effort. No accessory muscle use.  Cardiovascular: Regular rate and rhythm, no murmurs / rubs / gallops. No extremity edema. 2+ pedal pulses. No carotid bruits.  Abdomen: no tenderness, no masses palpated. No hepatosplenomegaly. Bowel sounds positive.  Musculoskeletal: no clubbing / cyanosis. No joint deformity upper and lower extremities. Good ROM, no contractures. Normal muscle tone.  Skin: no rashes, lesions, ulcers. No induration Neurologic: CN 2-12 grossly intact.  Decreased light touch sensation on right face, right arm and right leg, DTR normal. Strength 5/5 in all 4.  Psychiatric: Normal judgment and insight. Alert and oriented x 3. Normal mood.     Labs on Admission: I have personally reviewed following labs and imaging studies  CBC: Recent Labs  Lab 08/13/24 0740  WBC 7.4  NEUTROABS 3.7  HGB 14.7  HCT 45.6  MCV 85.2  PLT 315   Basic Metabolic Panel: Recent Labs  Lab 08/13/24 0740  NA 137  K 3.8  CL 102  CO2 23  GLUCOSE 105*  BUN 14  CREATININE 1.41*  CALCIUM 9.0   GFR: Estimated Creatinine Clearance: 85.2 mL/min (A) (by C-G formula based on SCr of 1.41 mg/dL (H)). Liver Function Tests: Recent Labs  Lab 08/13/24 0740  AST 14*  ALT 13  ALKPHOS 70  BILITOT 0.6  PROT 7.1  ALBUMIN 3.6   No results for input(s): LIPASE, AMYLASE in the last 168 hours. No results for input(s): AMMONIA in the last 168 hours. Coagulation Profile: Recent Labs  Lab 08/13/24 0740  INR 0.9   Cardiac Enzymes: No results for input(s): CKTOTAL, CKMB, CKMBINDEX, TROPONINI in the last 168 hours. BNP (last 3 results) No results for input(s): PROBNP in the last 8760 hours. HbA1C: No results for  input(s): HGBA1C in the last 72 hours. CBG: No results for input(s): GLUCAP in the last 168 hours. Lipid Profile: No results for input(s): CHOL, HDL, LDLCALC, TRIG, CHOLHDL, LDLDIRECT in the last 72 hours. Thyroid Function Tests: No results for input(s): TSH, T4TOTAL, FREET4, T3FREE, THYROIDAB in the last 72 hours. Anemia Panel: No results for input(s): VITAMINB12, FOLATE, FERRITIN, TIBC, IRON, RETICCTPCT in the last 72 hours. Urine analysis:    Component Value Date/Time   COLORURINE YELLOW 10/15/2014 0330   APPEARANCEUR CLOUDY (A) 10/15/2014 0330   APPEARANCEUR Clear 07/26/2014 2051   LABSPEC 1.007 10/15/2014 0330   LABSPEC 1.012 07/26/2014 2051   PHURINE 7.5 10/15/2014 0330   GLUCOSEU NEGATIVE 10/15/2014 0330   GLUCOSEU Negative 07/26/2014 2051   HGBUR LARGE (A) 10/15/2014 0330   BILIRUBINUR neg 02/01/2023 0807   BILIRUBINUR Negative 07/26/2014 2051   THEADORE  NEGATIVE 10/15/2014 0330   PROTEINUR Positive (A) 02/01/2023 0807   PROTEINUR 100 (A) 10/15/2014 0330   UROBILINOGEN 0.2 02/01/2023 0807   UROBILINOGEN 0.2 10/15/2014 0330   NITRITE neg 02/01/2023 0807   NITRITE NEGATIVE 10/15/2014 0330   LEUKOCYTESUR Negative 02/01/2023 0807   LEUKOCYTESUR Negative 07/26/2014 2051    Radiological Exams on Admission: CT HEAD WO CONTRAST Result Date: 08/13/2024 EXAM: CT HEAD WITHOUT CONTRAST 08/13/2024 07:46:59 AM TECHNIQUE: CT of the head was performed without the administration of intravenous contrast. Automated exposure control, iterative reconstruction, and/or weight based adjustment of the mA/kV was utilized to reduce the radiation dose to as low as reasonably achievable. COMPARISON: CT of the head dated 10/13/2014. CLINICAL HISTORY: Neuro deficit, acute, stroke suspected. Pt has numbness in right area of lip, arm, and leg which started at 12am last night. BP 187/130 was this morning per girlfriend. Only medical hx is seizures for which he takes  medication. FINDINGS: BRAIN AND VENTRICLES: No acute hemorrhage. No evidence of acute infarct. No hydrocephalus. No extra-axial collection. No mass effect or midline shift. ORBITS: No acute abnormality. SINUSES: Moderate opacification of the left sphenoid sinus and mild opacification of the ethmoid air cells bilaterally. SOFT TISSUES AND SKULL: No acute soft tissue abnormality. No skull fracture. IMPRESSION: 1. No acute intracranial abnormality. 2. Paranasal sinus disease with moderate left sphenoid and mild bilateral ethmoid opacification. Electronically signed by: Evalene Coho MD 08/13/2024 08:22 AM EDT RP Workstation: [id number]    EKG: Independently reviewed.  Sinus, no acute ST changes.  Assessment/Plan Principal Problem:   Stroke [facility]) Active Problems:   Stroke (cerebrum) (HCC)  (please populate well all problems here in Problem List. (For example, if patient is on BP meds at home and you resume or decide to hold them, it is a problem that needs to be her. Same for CAD, COPD, HLD and so on)  Acute right-sided paresthesia - Given concurrent significantly elevated blood pressure, clinically suspect lacunar stroke, possibly left thalamus region.  MRI MRA ordered - Allow permissive hypertension - Aspirin Plavix regimen for 3 months then aspirin single therapy. - Risk factor modification, check A1c and lipid panel - Cocaine cessation education - Check UDS  HTN, uncontrolled - As needed hydralazine for 2 days for BP more than 200/100  Cocaine abuse - Denies any chest pain. - Permissive hypertension as mentioned above  Cigarette smoking - Cessation education performed at bedside  History of seizure disorder - No acute concern.  DVT prophylaxis: Lovenox  Code Status: Full code Family Communication: Wife at bedside Disposition Plan: Expect less than 2 midnight hospital stay Consults called: None Admission status: Telemetry observation   Cort ONEIDA Mana MD [facility] Pager [phone]  08/13/2024, 10:59 AM

## 2024-08-13 NOTE — Plan of Care (Signed)

## 2024-08-13 NOTE — ED Triage Notes (Signed)
 Pt has numbness in right area of lip, arm, and leg which started at 12am last night. BP 187/130 was this morning per girlfriend. Only medical hx is seizures for which he takes medication

## 2024-08-13 NOTE — Progress Notes (Signed)
 Patient arrived to unit at 1100, alert and oriented x4.  Patient oriented to room and unit with not complaints.  Transferred to MRI in bed at 1155 in stable condition; returned from MRI at 1302 in bed in stable condition.

## 2024-08-13 NOTE — Progress Notes (Signed)
 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 Patient to be off floor for MRI. Will complete intake questions at later time.

## 2024-08-13 NOTE — ED Provider Notes (Signed)
 [facility] Provider Note    Event Date/Time   First MD Initiated Contact with Patient 08/13/24 [phone]     (approximate)   History   Numbness   HPI  Daniel Goodman is a 43 y.o. male with history of GERD, hypertension, seizure, presenting with numbness to his right side, states that started at 12 AM last night.  Was noted to be hypertensive this morning.  Girlfriend he has had no breakthrough seizures. he denies any weakness, no prior history of strokes.  No headaches or recent trauma or falls.  No back pain or infectious symptoms.  Was in his usual state of health prior to symptoms occurring.  Is not on any antihypertensives.  No blood thinners.  Independent history obtained from girlfriend as above.  On independent chart review, he was seen by neurology in November of last year, was following up for breakthrough seizures there was described as general tonic-clonic.  Was continued on his Keppra  twice daily.     Physical Exam   Triage Vital Signs: ED Triage Vitals  Encounter Vitals Group     BP 08/13/24 0729 (!) 177/127     Girls Systolic BP Percentile --      Girls Diastolic BP Percentile --      Boys Systolic BP Percentile --      Boys Diastolic BP Percentile --      Pulse Rate 08/13/24 0729 96     Resp 08/13/24 0729 16     Temp 08/13/24 0729 98.9 F (37.2 C)     Temp Source 08/13/24 0729 Oral     SpO2 08/13/24 0729 99 %     Weight 08/13/24 0730 238 lb (108 kg)     Height 08/13/24 0730 6' 1 (1.854 m)     Head Circumference --      Peak Flow --      Pain Score 08/13/24 0730 0     Pain Loc --      Pain Education --      Exclude from Growth Chart --     Most recent vital signs: Vitals:   08/13/24 0729 08/13/24 0805  BP: (!) 177/127 (!) 167/129  Pulse: 96 89  Resp: 16 (!) 21  Temp: 98.9 F (37.2 C)   SpO2: 99% 94%     General: Awake, no distress.  CV:  Good peripheral perfusion.  Resp:  Normal effort.  Abd:  No distention.  Soft  nontender Other:  Pupils are equal and reactive, extraocular movements are intact, no facial droop or slurred speech, he does have decreased sensation to the right lower face, upper and lower extremity compared to the left.  No focal weakness, no dysmetria.   ED Results / Procedures / Treatments   Labs (all labs ordered are listed, but only abnormal results are displayed) Labs Reviewed  COMPREHENSIVE METABOLIC PANEL WITH GFR - Abnormal; Notable for the following components:      Result Value   Glucose, Bld 105 (*)    Creatinine, Ser 1.41 (*)    AST 14 (*)    All other components within normal limits  CBC WITH DIFFERENTIAL/PLATELET - Abnormal; Notable for the following components:   Eosinophils Absolute 0.8 (*)    All other components within normal limits  ETHANOL  PROTIME-INR  APTT  CBC  DIFFERENTIAL  URINE DRUG SCREEN, QUALITATIVE ([facility] ONLY)     EKG  EKG shows, sinus rhythm, rate of 89, normal QS, normal QTc, no  obvious ischemic ST elevation,   RADIOLOGY On my independent interpretation, CT head without obvious intracranial hemorrhage   PROCEDURES:  Critical Care performed: No  Procedures   MEDICATIONS ORDERED IN ED: Medications - No data to display   IMPRESSION / MDM / ASSESSMENT AND PLAN / ED COURSE  I reviewed the triage vital signs and the nursing notes.                              Differential diagnosis includes, but is not limited to, CVA, TIA, mass, electrolyte derangements.  Labs, EKG, CT, MRI.  No stroke alert was activated given that last known well was prior to 12 AM and he is outside tPA window.  He will likely need to be admitted for stroke workup.  Patient's presentation is most consistent with acute presentation with potential threat to life or bodily function.  Independent interpretation of labs and imaging below.  CT head without acute findings, given his decreased sensation to the right side and his respecters, I believe he should come in  for stroke workup.  Added an MRI.  Consulted hospitalist who is agreeable with the plan for admission and will evaluate the patient.  He is admitted.  The patient is on the cardiac monitor to evaluate for evidence of arrhythmia and/or significant heart rate changes.   Clinical Course as of 08/13/24 0905  Mon Aug 13, 2024  0827 Independent review of labs, electrolytes not severely deranged, creatinine is mildly elevated but downtrending compared to prior, no leukocytosis. [TT]  0830 CT HEAD WO CONTRAST IMPRESSION: 1. No acute intracranial abnormality. 2. Paranasal sinus disease with moderate left sphenoid and mild bilateral ethmoid opacification.   [TT]    Clinical Course User Index [TT] Waymond, Lorelle Cummins, MD     FINAL CLINICAL IMPRESSION(S) / ED DIAGNOSES   Final diagnoses:  Numbness     Rx / DC Orders   ED Discharge Orders     None        Note:  This document was prepared using Dragon voice recognition software and may include unintentional dictation errors.    Waymond Lorelle Cummins, MD 08/13/24 [phone]

## 2024-08-13 NOTE — Progress Notes (Signed)
 OT Cancellation Note  Patient Details Name: Daniel Goodman MRN: [id number] DOB: Feb 19, 1981   Cancelled Treatment:    Reason Eval/Treat Not Completed: Patient at procedure or test/ unavailable. Pt currently off unit for MRI as well as BP remains elevated at 176/113. OT to re-attempt evaluation when pt is next available and safely able to actively participate in functional mobility.  Izetta Claude, MS, OTR/L , CBIS ascom [phone]  08/13/24, 12:39 PM

## 2024-08-13 NOTE — Progress Notes (Addendum)
 SLP Cancellation Note  Patient Details Name: Daniel Goodman MRN: [id number] DOB: October 22, 1981   Cancelled treatment:       Reason Eval/Treat Not Completed: SLP screened, no needs identified, will sign off (chart reviewed; consulted NSG then met w/ pt, family in room.)  Pt is a 43 y.o. male with history of GERD, hypertension, seizure, presenting with numbness to his right side, states that started at 12 AM last night.  Was noted to be hypertensive this morning.  Girlfriend he has had no breakthrough seizures. he denies any weakness, no prior history of strokes. MRI pending; Head CT Negative. Pt denied any difficulty swallowing and is currently on a regular diet post consulting MD for diet after pt passed his swallow screen at bedside w/ NSG. He tolerates swallowing pills w/ water per NSG. Pt conversed in full conversation w/out expressive/receptive deficits noted; pt denied any speech-language deficits. He c/o slight numbness in mouth and a dry throat. NSG made aware. Speech intelligible, clear.  No further skilled ST services indicated as pt appears at is communication/language baseline. Recommended f/u w/ PCP if mouth numbness continues post d/c home. Pt agreed. NSG updated and will reconsult if any change in status while admitted.     Comer Portugal, MS, CCC-SLP Speech Language Pathologist Rehab Services; [facility] [phone] (ascom) Sharmel Ballantine 08/13/2024, 10:54 AM

## 2024-08-14 ENCOUNTER — Other Ambulatory Visit: Payer: Self-pay

## 2024-08-14 ENCOUNTER — Observation Stay
Admit: 2024-08-14 | Discharge: 2024-08-14 | Disposition: A | Discharge status: Home / Self Care | Payer: Self-pay | Attending: Internal Medicine | Admitting: Internal Medicine

## 2024-08-14 DIAGNOSIS — F1410 Cocaine abuse, uncomplicated: Secondary | ICD-10-CM

## 2024-08-14 DIAGNOSIS — Z8673 Personal history of transient ischemic attack (TIA), and cerebral infarction without residual deficits: Secondary | ICD-10-CM

## 2024-08-14 DIAGNOSIS — I10 Essential (primary) hypertension: Secondary | ICD-10-CM

## 2024-08-14 DIAGNOSIS — I6381 Other cerebral infarction due to occlusion or stenosis of small artery: Secondary | ICD-10-CM

## 2024-08-14 DIAGNOSIS — F1411 Cocaine abuse, in remission: Secondary | ICD-10-CM

## 2024-08-14 HISTORY — DX: Essential (primary) hypertension: I10

## 2024-08-14 LAB — ECHOCARDIOGRAM COMPLETE
AR max vel: 2.63 cm2
AV Area VTI: 2.89 cm2
AV Area mean vel: 2.44 cm2
AV Mean grad: 4 mmHg
AV Peak grad: 6.2 mmHg
Ao pk vel: 1.24 m/s
Area-P 1/2: 4.31 cm2
Height: 73 in
MV VTI: 2.88 cm2
S' Lateral: 2.9 cm
Weight: 3808 [oz_av]

## 2024-08-14 LAB — LIPID PANEL
Cholesterol: 186 mg/dL (ref 0–200)
HDL: 40 mg/dL — ABNORMAL LOW (ref >40)
LDL Cholesterol: 114 mg/dL — ABNORMAL HIGH (ref 0–99)
Total CHOL/HDL Ratio: 4.7 ratio
Triglycerides: 158 mg/dL — ABNORMAL HIGH (ref <150)
VLDL: 32 mg/dL (ref 0–40)

## 2024-08-14 MED ORDER — HYDRALAZINE HCL 20 MG/ML IJ SOLN: 10.0000 mg | Freq: Four times a day (QID) | INTRAMUSCULAR | Status: DC | PRN

## 2024-08-14 MED ORDER — LISINOPRIL 10 MG PO TABS
10.0000 mg | ORAL_TABLET | Freq: Every day | ORAL | 2 refills | Status: DC
  Filled 2024-08-14: qty 30, 30d supply, fill #0

## 2024-08-14 MED ORDER — AMLODIPINE BESYLATE 5 MG PO TABS: 5.0000 mg | ORAL_TABLET | Freq: Every day | ORAL | Status: DC

## 2024-08-14 MED ORDER — AMLODIPINE BESYLATE 10 MG PO TABS
10.0000 mg | ORAL_TABLET | Freq: Every day | ORAL | Status: DC
  Administered 2024-08-14: 10 mg via ORAL
  Filled 2024-08-14: qty 1

## 2024-08-14 MED ORDER — ATORVASTATIN CALCIUM 20 MG PO TABS
40.0000 mg | ORAL_TABLET | Freq: Every day | ORAL | Status: DC
  Administered 2024-08-14: 40 mg via ORAL
  Filled 2024-08-14: qty 2

## 2024-08-14 MED ORDER — ASPIRIN 81 MG PO TBEC
81.0000 mg | DELAYED_RELEASE_TABLET | Freq: Every day | ORAL | 12 refills | Status: DC
  Filled 2024-08-14: qty 30, 30d supply, fill #0

## 2024-08-14 MED ORDER — CLOPIDOGREL BISULFATE 75 MG PO TABS
75.0000 mg | ORAL_TABLET | Freq: Every day | ORAL | 0 refills | Status: DC
  Filled 2024-08-14: qty 21, 21d supply, fill #0

## 2024-08-14 MED ORDER — ATORVASTATIN CALCIUM 40 MG PO TABS
40.0000 mg | ORAL_TABLET | Freq: Every day | ORAL | 2 refills | Status: DC
  Filled 2024-08-14: qty 30, 30d supply, fill #0

## 2024-08-14 MED ORDER — LISINOPRIL 10 MG PO TABS
10.0000 mg | ORAL_TABLET | Freq: Every day | ORAL | Status: DC
  Administered 2024-08-14: 10 mg via ORAL
  Filled 2024-08-14: qty 1

## 2024-08-14 MED ORDER — AMLODIPINE BESYLATE 10 MG PO TABS
10.0000 mg | ORAL_TABLET | Freq: Every day | ORAL | 2 refills | Status: DC
  Filled 2024-08-14: qty 30, 30d supply, fill #0

## 2024-08-14 NOTE — Progress Notes (Signed)
 OT Cancellation Note  Patient Details Name: PAXON PROPES MRN: [id number] DOB: August 30, 1981   Cancelled Treatment:    Reason Eval/Treat Not Completed: OT screened, no needs identified, will sign off. Per PT, pt has mild sensation deficits, but no acute OT needs necessary. Will sign off in house, can be consulted again if new needs arise.  Wilbert Schouten E Laneya Gasaway 08/14/2024, 9:33 AM

## 2024-08-14 NOTE — Progress Notes (Signed)
 Completed intake questions. Patient states he has an established PCP. He denies any SDOH needs at present time. He is employed and works Public relations account executive.  He does endorse intranasal cocaine use around 3-4 times a week. Patient has no interest in quitting or obtaining substance abuse resources. Patient also has a history of incarceration. Information for [employer] provided.  Encouraged to call with questions or concerns.

## 2024-08-14 NOTE — Discharge Summary (Signed)
 Physician Discharge Summary   Patient: Daniel Goodman MRN: [id number] DOB: Apr 29, 1981  Admit date:     08/13/2024  Discharge date: 08/14/24  Discharge Physician: Daniel Goodman   PCP: Daniel Toribio SQUIBB, PA   Recommendations at discharge:   follow-up with PCP in 1 to 2 weeks keep log of your blood pressure at home abstain from using street drugs  Discharge Diagnoses: Principal Problem:   Stroke [facility]) Active Problems:   Stroke (cerebrum) (HCC)   From H and P : Daniel Goodman is a 43 y.o. male with medical history significant of cocaine abuse, seizure disorder, HTN not on any BP medications presented with strokelike symptoms.   Patient woke up this morning with new onset of right-sided numbness and tingling sensation involving right-sided face, right-sided lips, both right arm and right lower extremities but denies any weakness of any limbs no speech problems no double vision.  Acute left thalamic Lacunar stroke in the setting of uncontrolled hypertension and cocaine abuse - Given concurrent significantly elevated blood pressure, clinically suspect lacunar stroke, possibly left thalamus region. --  MRI MRA 5 mm acute left thalamocapsular infarct. 2. Age advanced chronic small vessel ischemic disease. 3. Negative head MRA. 4. Negative neck MRA within above described limitations.  -- Patient says right left-sided symptoms are improving --Aspirin Plavix regimen for 3 three weeks then aspirin single therapy. -- Start statins  -- A1c 5.2 -- Echo shows EF of 60 to 65% -seen by PT OT and-speech.-- No need for therapy   HTN, uncontrolled - As needed hydralazine  -- started on amlodipine and lisinopril -- patient advised to keep log of blood pressure at home and discussed with PCP  Cocaine abuse -advised cessation   Cigarette smoking -- advised cessation  History of seizure disorder - No acute concern.  Overall hemodynamically stable. Blood pressure much improved. Patient is  eager to go home. Prescriptions sent to [facility] pharmacy meds to bed.   DVT prophylaxis: Lovenox  Code Status: Full code Family Communication:none Disposition Plan: home today       Disposition: Home Diet recommendation:  Discharge Diet Orders (From admission, onward)     Start     Ordered   08/14/24 0000  Diet - low sodium heart healthy        08/14/24 1252           Cardiac diet DISCHARGE MEDICATION: Allergies as of 08/14/2024   No Known Allergies      Medication List     STOP taking these medications    levETIRAcetam  1000 MG tablet Commonly known as: KEPPRA    tamsulosin  0.4 MG Caps capsule Commonly known as: FLOMAX        TAKE these medications    albuterol  108 (90 Base) MCG/ACT inhaler Commonly known as: VENTOLIN  HFA Inhale 1-2 puffs into the lungs every 4 (four) hours as needed for wheezing or shortness of breath.   amLODipine 10 MG tablet Commonly known as: NORVASC Take 1 tablet (10 mg total) by mouth daily. Start taking on: August 15, 2024   aspirin EC 81 MG tablet Take 1 tablet (81 mg total) by mouth daily. Swallow whole. Start taking on: August 15, 2024   atorvastatin 40 MG tablet Commonly known as: LIPITOR Take 1 tablet (40 mg total) by mouth daily. Start taking on: August 15, 2024   clopidogrel 75 MG tablet Commonly known as: PLAVIX Take 1 tablet (75 mg total) by mouth daily for 21 days. Start taking on: August 15, 2024  ipratropium 0.06 % nasal spray Commonly known as: ATROVENT  Place 2 sprays into both nostrils 4 (four) times daily.   lisinopril 10 MG tablet Commonly known as: ZESTRIL Take 1 tablet (10 mg total) by mouth daily. Start taking on: August 15, 2024   omeprazole 20 MG capsule Commonly known as: PRILOSEC Take 1 capsule by mouth daily.        Follow-up Information     Daniel Toribio SQUIBB, PA Follow up.   Specialty: Physician Assistant Why: hospital follow up Contact information: [address] [place]  [address] [place] [address] [phone]                Discharge Exam: Daniel Goodman   08/13/24 0730  Weight: 108 kg   Alert and oriented times three respiratory clear to auscultation cardiovascular both heart sounds normal neuro- left-sided numbness improving. No motor deficit  Condition at discharge: fair  The results of significant diagnostics from this hospitalization (including imaging, microbiology, ancillary and laboratory) are listed below for reference.   Imaging Studies: ECHOCARDIOGRAM COMPLETE Result Date: 08/14/2024    ECHOCARDIOGRAM REPORT   Patient Name:   Daniel Goodman Date of Exam: 08/14/2024 Medical Rec #:  [id number]        Height:       73.0 in Accession #:    [id number]       Weight:       238.0 lb Date of Birth:  1981-03-09         BSA:          2.317 m Patient Age:    43 years         BP:           151/111 mmHg Patient Gender: M                HR:           83 bpm. Exam Location:  [facility] Procedure: 2D Echo, Cardiac Doppler, Color Doppler, Saline Contrast Bubble Study            and Strain Analysis (Both Spectral and Color Flow Doppler were            utilized during procedure). Indications:     Stroke I63.9  History:         Patient has no prior history of Echocardiogram examinations.                  Risk Factors:Hypertension and Current Smoker.  Sonographer:     Christopher Furnace Referring Phys:  [id number] Daniel Goodman Diagnosing Phys: Daniel Dooms MD  Sonographer Comments: Global longitudinal strain was attempted. IMPRESSIONS  1. Left ventricular ejection fraction, by estimation, is 60 to 65%. The left ventricle has normal function. The left ventricle has no regional wall motion abnormalities. Left ventricular diastolic parameters were normal. The global longitudinal strain is abnormal.  2. Right ventricular systolic function is normal. The right ventricular size is normal.  3. The mitral valve is normal in structure. Trivial mitral valve regurgitation. No evidence of  mitral stenosis.  4. The aortic valve is normal in structure. Aortic valve regurgitation is not visualized. No aortic stenosis is present.  5. The inferior vena cava is normal in size with greater than 50% respiratory variability, suggesting right atrial pressure of 3 mmHg.  6. Agitated saline contrast bubble study was negative, with no evidence of any interatrial shunt. FINDINGS  Left Ventricle: Left ventricular ejection fraction, by estimation, is 60 to 65%. The  left ventricle has normal function. The left ventricle has no regional wall motion abnormalities. Strain was performed and the global longitudinal strain is abnormal. The left ventricular internal cavity size was normal in size. There is no left ventricular hypertrophy. Left ventricular diastolic parameters were normal. Right Ventricle: The right ventricular size is normal. No increase in right ventricular wall thickness. Right ventricular systolic function is normal. Left Atrium: Left atrial size was normal in size. Right Atrium: Right atrial size was normal in size. Pericardium: There is no evidence of pericardial effusion. Mitral Valve: The mitral valve is normal in structure. Trivial mitral valve regurgitation. No evidence of mitral valve stenosis. MV peak gradient, 3.0 mmHg. The mean mitral valve gradient is 1.0 mmHg. Tricuspid Valve: The tricuspid valve is normal in structure. Tricuspid valve regurgitation is trivial. No evidence of tricuspid stenosis. Aortic Valve: The aortic valve is normal in structure. Aortic valve regurgitation is not visualized. No aortic stenosis is present. Aortic valve mean gradient measures 4.0 mmHg. Aortic valve peak gradient measures 6.2 mmHg. Aortic valve area, by VTI measures 2.89 cm. Pulmonic Valve: The pulmonic valve was normal in structure. Pulmonic valve regurgitation is not visualized. No evidence of pulmonic stenosis. Aorta: The aortic root is normal in size and structure. Venous: The inferior vena cava is normal  in size with greater than 50% respiratory variability, suggesting right atrial pressure of 3 mmHg. IAS/Shunts: No atrial level shunt detected by color flow Doppler. Agitated saline contrast was given intravenously to evaluate for intracardiac shunting. Agitated saline contrast bubble study was negative, with no evidence of any interatrial shunt. Additional Comments: 3D was performed not requiring image post processing on an independent workstation and was abnormal.  LEFT VENTRICLE PLAX 2D LVIDd:         4.30 cm   Diastology LVIDs:         2.90 cm   LV e' medial:    9.57 cm/s LV PW:         1.30 cm   LV E/e' medial:  9.1 LV IVS:        1.30 cm   LV e' lateral:   11.70 cm/s LVOT diam:     2.10 cm   LV E/e' lateral: 7.4 LV SV:         69 LV SV Index:   30 LVOT Area:     3.46 cm  RIGHT VENTRICLE RV Basal diam:  3.40 cm RV Mid diam:    3.10 cm LEFT ATRIUM             Index        RIGHT ATRIUM           Index LA diam:        2.00 cm 0.86 cm/m   RA Area:     13.40 cm LA Vol (A2C):   28.6 ml 12.34 ml/m  RA Volume:   32.60 ml  14.07 ml/m LA Vol (A4C):   32.7 ml 14.11 ml/m LA Biplane Vol: 30.9 ml 13.34 ml/m  AORTIC VALVE AV Area (Vmax):    2.63 cm AV Area (Vmean):   2.44 cm AV Area (VTI):     2.89 cm AV Vmax:           124.00 cm/s AV Vmean:          90.200 cm/s AV VTI:            0.237 m AV Peak Grad:      6.2 mmHg AV Mean  Grad:      4.0 mmHg LVOT Vmax:         94.20 cm/s LVOT Vmean:        63.500 cm/s LVOT VTI:          0.198 m LVOT/AV VTI ratio: 0.84  AORTA Ao Root diam: 3.20 cm MITRAL VALVE               TRICUSPID VALVE MV Area (PHT): 4.31 cm    TR Peak grad:   15.4 mmHg MV Area VTI:   2.88 cm    TR Vmax:        196.00 cm/s MV Peak grad:  3.0 mmHg MV Mean grad:  1.0 mmHg    SHUNTS MV Vmax:       0.86 m/s    Systemic VTI:  0.20 m MV Vmean:      57.4 cm/s   Systemic Diam: 2.10 cm MV Decel Time: 176 msec MV E velocity: 87.00 cm/s MV A velocity: 68.10 cm/s MV E/A ratio:  1.28 Daniel Dooms MD Electronically  signed by Daniel Dooms MD Signature Date/Time: 08/14/2024/8:25:04 AM    Final    MR Brain Wo Contrast (neuro protocol) Result Date: 08/13/2024 CLINICAL DATA:  Neuro deficit, acute, stroke suspected. Right-sided numbness and tingling. Headache. History of seizure disorder. EXAM: MRI HEAD WITHOUT CONTRAST MRA HEAD WITHOUT CONTRAST MRA NECK WITHOUT CONTRAST TECHNIQUE: Multiplanar, multiecho pulse sequences of the brain and surrounding structures were obtained without intravenous contrast. Angiographic images of the Circle of Willis were obtained using MRA technique without intravenous contrast. Angiographic images of the neck were obtained using MRA technique without intravenous contrast. Carotid stenosis measurements (when applicable) are obtained utilizing NASCET criteria, using the distal internal carotid diameter as the denominator. COMPARISON:  CT head 08/13/2024 FINDINGS: MRI HEAD FINDINGS Brain: There is a 5 mm acute infarct along the lateral left thalamus/posterior limb of internal capsule. T2 hyperintensities elsewhere in the cerebral white matter bilaterally are mild but abnormal for age and nonspecific but may reflect chronic small vessel ischemic disease given reported history of untreated hypertension. A discrete chronic lacunar infarct is present in the right frontal periventricular white matter. Cerebral volume is normal. The ventricles are normal in size. No intracranial hemorrhage, mass, midline shift, or extra-axial fluid collection is identified. Vascular: Major intracranial vascular flow voids are preserved. Skull and upper cervical spine: Unremarkable bone marrow signal. Sinuses/Orbits: Unremarkable orbits. Mucous retention cyst in the left sphenoid sinus. Mild scattered mucosal thickening in the frontal and ethmoid sinuses. No significant mastoid fluid. Other: None. MRA HEAD FINDINGS The included portions of the intracranial vertebral arteries are widely patent to the basilar. Patent PICA  and SCA origins are visualized bilaterally. The basilar artery is widely patent. There are small left and diminutive or absent right posterior communicating arteries. Both PCAs are patent without evidence of a significant proximal stenosis. The internal carotid arteries are widely patent from skull base to carotid termini. ACAs and MCAs are patent without evidence of a proximal branch occlusion or significant proximal stenosis. A median artery of the corpus callosum is noted. No aneurysm is identified. MRA NECK FINDINGS Evaluation is limited by noncontrast technique and motion artifact. The brachiocephalic and subclavian arteries as well as proximal portions of the common carotid and vertebral arteries are poorly evaluated. The remainder of the common carotid arteries and cervical internal carotid arteries are widely patent without evidence of a significant stenosis or dissection. The vertebral arteries are patent from the proximal V2 segments  distally with antegrade flow bilaterally and no evidence of a significant stenosis or dissection where adequately assessed. The left vertebral artery is mildly dominant. IMPRESSION: 1. 5 mm acute left thalamocapsular infarct. 2. Age advanced chronic small vessel ischemic disease. 3. Negative head MRA. 4. Negative neck MRA within above described limitations. Electronically Signed   By: Dasie Hamburg M.D.   On: 08/13/2024 15:05   MR ANGIO HEAD WO CONTRAST Result Date: 08/13/2024 CLINICAL DATA:  Neuro deficit, acute, stroke suspected. Right-sided numbness and tingling. Headache. History of seizure disorder. EXAM: MRI HEAD WITHOUT CONTRAST MRA HEAD WITHOUT CONTRAST MRA NECK WITHOUT CONTRAST TECHNIQUE: Multiplanar, multiecho pulse sequences of the brain and surrounding structures were obtained without intravenous contrast. Angiographic images of the Circle of Willis were obtained using MRA technique without intravenous contrast. Angiographic images of the neck were obtained using  MRA technique without intravenous contrast. Carotid stenosis measurements (when applicable) are obtained utilizing NASCET criteria, using the distal internal carotid diameter as the denominator. COMPARISON:  CT head 08/13/2024 FINDINGS: MRI HEAD FINDINGS Brain: There is a 5 mm acute infarct along the lateral left thalamus/posterior limb of internal capsule. T2 hyperintensities elsewhere in the cerebral white matter bilaterally are mild but abnormal for age and nonspecific but may reflect chronic small vessel ischemic disease given reported history of untreated hypertension. A discrete chronic lacunar infarct is present in the right frontal periventricular white matter. Cerebral volume is normal. The ventricles are normal in size. No intracranial hemorrhage, mass, midline shift, or extra-axial fluid collection is identified. Vascular: Major intracranial vascular flow voids are preserved. Skull and upper cervical spine: Unremarkable bone marrow signal. Sinuses/Orbits: Unremarkable orbits. Mucous retention cyst in the left sphenoid sinus. Mild scattered mucosal thickening in the frontal and ethmoid sinuses. No significant mastoid fluid. Other: None. MRA HEAD FINDINGS The included portions of the intracranial vertebral arteries are widely patent to the basilar. Patent PICA and SCA origins are visualized bilaterally. The basilar artery is widely patent. There are small left and diminutive or absent right posterior communicating arteries. Both PCAs are patent without evidence of a significant proximal stenosis. The internal carotid arteries are widely patent from skull base to carotid termini. ACAs and MCAs are patent without evidence of a proximal branch occlusion or significant proximal stenosis. A median artery of the corpus callosum is noted. No aneurysm is identified. MRA NECK FINDINGS Evaluation is limited by noncontrast technique and motion artifact. The brachiocephalic and subclavian arteries as well as proximal  portions of the common carotid and vertebral arteries are poorly evaluated. The remainder of the common carotid arteries and cervical internal carotid arteries are widely patent without evidence of a significant stenosis or dissection. The vertebral arteries are patent from the proximal V2 segments distally with antegrade flow bilaterally and no evidence of a significant stenosis or dissection where adequately assessed. The left vertebral artery is mildly dominant. IMPRESSION: 1. 5 mm acute left thalamocapsular infarct. 2. Age advanced chronic small vessel ischemic disease. 3. Negative head MRA. 4. Negative neck MRA within above described limitations. Electronically Signed   By: Dasie Hamburg M.D.   On: 08/13/2024 15:05   MR ANGIO NECK WO CONTRAST Result Date: 08/13/2024 CLINICAL DATA:  Neuro deficit, acute, stroke suspected. Right-sided numbness and tingling. Headache. History of seizure disorder. EXAM: MRI HEAD WITHOUT CONTRAST MRA HEAD WITHOUT CONTRAST MRA NECK WITHOUT CONTRAST TECHNIQUE: Multiplanar, multiecho pulse sequences of the brain and surrounding structures were obtained without intravenous contrast. Angiographic images of the Circle of Willis were obtained  using MRA technique without intravenous contrast. Angiographic images of the neck were obtained using MRA technique without intravenous contrast. Carotid stenosis measurements (when applicable) are obtained utilizing NASCET criteria, using the distal internal carotid diameter as the denominator. COMPARISON:  CT head 08/13/2024 FINDINGS: MRI HEAD FINDINGS Brain: There is a 5 mm acute infarct along the lateral left thalamus/posterior limb of internal capsule. T2 hyperintensities elsewhere in the cerebral white matter bilaterally are mild but abnormal for age and nonspecific but may reflect chronic small vessel ischemic disease given reported history of untreated hypertension. A discrete chronic lacunar infarct is present in the right frontal  periventricular white matter. Cerebral volume is normal. The ventricles are normal in size. No intracranial hemorrhage, mass, midline shift, or extra-axial fluid collection is identified. Vascular: Major intracranial vascular flow voids are preserved. Skull and upper cervical spine: Unremarkable bone marrow signal. Sinuses/Orbits: Unremarkable orbits. Mucous retention cyst in the left sphenoid sinus. Mild scattered mucosal thickening in the frontal and ethmoid sinuses. No significant mastoid fluid. Other: None. MRA HEAD FINDINGS The included portions of the intracranial vertebral arteries are widely patent to the basilar. Patent PICA and SCA origins are visualized bilaterally. The basilar artery is widely patent. There are small left and diminutive or absent right posterior communicating arteries. Both PCAs are patent without evidence of a significant proximal stenosis. The internal carotid arteries are widely patent from skull base to carotid termini. ACAs and MCAs are patent without evidence of a proximal branch occlusion or significant proximal stenosis. A median artery of the corpus callosum is noted. No aneurysm is identified. MRA NECK FINDINGS Evaluation is limited by noncontrast technique and motion artifact. The brachiocephalic and subclavian arteries as well as proximal portions of the common carotid and vertebral arteries are poorly evaluated. The remainder of the common carotid arteries and cervical internal carotid arteries are widely patent without evidence of a significant stenosis or dissection. The vertebral arteries are patent from the proximal V2 segments distally with antegrade flow bilaterally and no evidence of a significant stenosis or dissection where adequately assessed. The left vertebral artery is mildly dominant. IMPRESSION: 1. 5 mm acute left thalamocapsular infarct. 2. Age advanced chronic small vessel ischemic disease. 3. Negative head MRA. 4. Negative neck MRA within above described  limitations. Electronically Signed   By: Dasie Hamburg M.D.   On: 08/13/2024 15:05   CT HEAD WO CONTRAST Result Date: 08/13/2024 EXAM: CT HEAD WITHOUT CONTRAST 08/13/2024 07:46:59 AM TECHNIQUE: CT of the head was performed without the administration of intravenous contrast. Automated exposure control, iterative reconstruction, and/or weight based adjustment of the mA/kV was utilized to reduce the radiation dose to as low as reasonably achievable. COMPARISON: CT of the head dated 10/13/2014. CLINICAL HISTORY: Neuro deficit, acute, stroke suspected. Pt has numbness in right area of lip, arm, and leg which started at 12am last night. BP 187/130 was this morning per girlfriend. Only medical hx is seizures for which he takes medication. FINDINGS: BRAIN AND VENTRICLES: No acute hemorrhage. No evidence of acute infarct. No hydrocephalus. No extra-axial collection. No mass effect or midline shift. ORBITS: No acute abnormality. SINUSES: Moderate opacification of the left sphenoid sinus and mild opacification of the ethmoid air cells bilaterally. SOFT TISSUES AND SKULL: No acute soft tissue abnormality. No skull fracture. IMPRESSION: 1. No acute intracranial abnormality. 2. Paranasal sinus disease with moderate left sphenoid and mild bilateral ethmoid opacification. Electronically signed by: Evalene Coho MD 08/13/2024 08:22 AM EDT RP Workstation: [id number]    Microbiology: Results  for orders placed or performed during the hospital encounter of 07/03/24  Resp Panel by RT-PCR (Flu A&B, Covid) Anterior Nasal Swab     Status: None   Collection Time: 07/03/24  4:39 PM   Specimen: Anterior Nasal Swab  Result Value Ref Range Status   SARS Coronavirus 2 by RT PCR NEGATIVE NEGATIVE Final    Comment: (NOTE) SARS-CoV-2 target nucleic acids are NOT DETECTED.  The SARS-CoV-2 RNA is generally detectable in upper respiratory specimens during the acute phase of infection. The lowest concentration of SARS-CoV-2 viral  copies this assay can detect is 138 copies/mL. A negative result does not preclude SARS-Cov-2 infection and should not be used as the sole basis for treatment or other patient management decisions. A negative result may occur with  improper specimen collection/handling, submission of specimen other than nasopharyngeal swab, presence of viral mutation(s) within the areas targeted by this assay, and inadequate number of viral copies(<138 copies/mL). A negative result must be combined with clinical observations, patient history, and epidemiological information. The expected result is Negative.  Fact Sheet for Patients:  BloggerCourse.com  Fact Sheet for Healthcare Providers:  SeriousBroker.it  This test is no t yet approved or cleared by the [place]  FDA and  has been authorized for detection and/or diagnosis of SARS-CoV-2 by FDA under an Emergency Use Authorization (EUA). This EUA will remain  in effect (meaning this test can be used) for the duration of the COVID-19 declaration under Section 564(b)(1) of the Act, 21 U.S.C.section 360bbb-3(b)(1), unless the authorization is terminated  or revoked sooner.       Influenza A by PCR NEGATIVE NEGATIVE Final   Influenza B by PCR NEGATIVE NEGATIVE Final    Comment: (NOTE) The Xpert Xpress SARS-CoV-2/FLU/RSV plus assay is intended as an aid in the diagnosis of influenza from Nasopharyngeal swab specimens and should not be used as a sole basis for treatment. Nasal washings and aspirates are unacceptable for Xpert Xpress SARS-CoV-2/FLU/RSV testing.  Fact Sheet for Patients: BloggerCourse.com  Fact Sheet for Healthcare Providers: SeriousBroker.it  This test is not yet approved or cleared by the [place]  FDA and has been authorized for detection and/or diagnosis of SARS-CoV-2 by FDA under an Emergency Use Authorization (EUA). This  EUA will remain in effect (meaning this test can be used) for the duration of the COVID-19 declaration under Section 564(b)(1) of the Act, 21 U.S.C. section 360bbb-3(b)(1), unless the authorization is terminated or revoked.  Performed at [facility], [address] [place], [place] [address]     Labs: CBC: Recent Labs  Lab 08/13/24 0740  WBC 7.4  NEUTROABS 3.7  HGB 14.7  HCT 45.6  MCV 85.2  PLT 315   Basic Metabolic Panel: Recent Labs  Lab 08/13/24 0740  NA 137  K 3.8  CL 102  CO2 23  GLUCOSE 105*  BUN 14  CREATININE 1.41*  CALCIUM 9.0   Liver Function Tests: Recent Labs  Lab 08/13/24 0740  AST 14*  ALT 13  ALKPHOS 70  BILITOT 0.6  PROT 7.1  ALBUMIN 3.6    Discharge time spent: greater than 30 minutes.  Signed: Leita Blanch, MD [facility] 08/14/2024

## 2024-08-14 NOTE — Discharge Instructions (Addendum)
 Your nurse navigator, Tiffanie, can be reached at [phone]   [facility] [place]  [facility] Damien Presser [phone] (case manager) [address] [place], [place]  Establish PCP in the area follow-up with your PCP in 1 to 2 week log of your blood pressure at home abstain from using street drugs.

## 2024-08-14 NOTE — Evaluation (Signed)
 Physical Therapy Evaluation Patient Details Name: Daniel Goodman MRN: [id number] DOB: 08-09-1981 Today's Date: 08/14/2024  History of Present Illness  Pt is a 43 y.o. 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 PMH of cocaine abuse, seizure disorder, HTN not on any BP medications. MD assessment includes stroke, acute right-sided paresthesia, uncontrolled HTN.   Clinical Impression  Pt A&Ox4, pleasant and agreeable to PT evaluation. At baseline, pt is IND with mobility/ADLs, denies hx of falls, currently works full-time and drives. Pt was met semi-supine in bed, IND with all bed mobility, transfers, and gait this date. Pt amb ~367ft with steady cadence, no LOB throughout. DGI assessed during amb with score of 22 indicating pt is not at increased risk of falls. Pt able to navigate 5 stairs during session with alternating pattern and no hand rail assist, no LOB. Pt was left sitting at EOB at end of session, all needs in reach. Pt appears to be at his baseline mobility level with his R sided sensation deficits not affecting his overall functional mobility. PT to sign off at this time with no post-acute follow-up.         If plan is discharge home, recommend the following:     Can travel by private vehicle        Equipment Recommendations None recommended by PT  Recommendations for Other Services       Functional Status Assessment Patient has had a recent decline in their functional status and demonstrates the ability to make significant improvements in function in a reasonable and predictable amount of time.     Precautions / Restrictions Precautions Precautions: None Restrictions Weight Bearing Restrictions Per Provider Order: No      Mobility  Bed Mobility Overal bed mobility: Independent             General bed mobility comments: no assistance required    Transfers Overall transfer level: Independent Equipment  used: None               General transfer comment: 3 STS from EOB, no physical assistance required    Ambulation/Gait Ambulation/Gait assistance: Independent Gait Distance (Feet): 350 Feet Assistive device: None Gait Pattern/deviations: Step-through pattern, Decreased stance time - right Gait velocity: normal     General Gait Details: no LOB, steady cadence throughout, increased L lateral trunk lean at times, occasional decreased stance time on RLE with turns but very mild  Stairs Stairs: Yes Stairs assistance: Independent Stair Management: No rails Number of Stairs: 5 General stair comments: able to navigate stairs with alternating pattern and no rails, steady balance throughout  Wheelchair Mobility     Tilt Bed    Modified Rankin (Stroke Patients Only)       Balance Overall balance assessment: Independent (able to stand for lower body dressing with steady balance)                               Standardized Balance Assessment Standardized Balance Assessment : Dynamic Gait Index   Dynamic Gait Index Level Surface: Normal Change in Gait Speed: Normal Gait with Horizontal Head Turns: Mild Impairment Gait with Vertical Head Turns: Mild Impairment Gait and Pivot Turn: Normal Step Over Obstacle: Normal Step Around Obstacles: Normal Steps: Normal Total Score: 22       Pertinent Vitals/Pain Pain Assessment Pain Assessment: No/denies pain    Home Living Family/patient expects to be discharged  to:: Private residence Living Arrangements: Spouse/significant other Available Help at Discharge: Family;Available PRN/intermittently Type of Home: Mobile home Home Access: Stairs to enter Entrance Stairs-Rails: Right;Left;Can reach both Entrance Stairs-Number of Steps: 5   Home Layout: One level Home Equipment: None      Prior Function Prior Level of Function : Independent/Modified Independent;Working/employed;Driving             Mobility  Comments: IND with mobility, denies hx of falls or previous AD use ADLs Comments: IND with ADLs, works full-time, drives     Extremity/Trunk Assessment   Upper Extremity Assessment Upper Extremity Assessment: Right hand dominant;RUE deficits/detail RUE Deficits / Details: strength grossly WFL, grip strength symmetrical to LUE RUE Sensation: decreased light touch    Lower Extremity Assessment Lower Extremity Assessment: RLE deficits/detail;LLE deficits/detail RLE Deficits / Details: strength grossly 5/5, heel to shin and alternating toe taps WFL. Functional mobility not limited by sensation deficits RLE Sensation: decreased light touch RLE Coordination: WNL LLE Deficits / Details: strength grossly 5/5, heel to shin and alternating toe taps WFL. LLE Sensation: WNL LLE Coordination: WNL       Communication   Communication Communication: No apparent difficulties    Cognition Arousal: Alert Behavior During Therapy: WFL for tasks assessed/performed   PT - Cognitive impairments: No apparent impairments                       PT - Cognition Comments: A&Ox4, pleasant and cooperative Following commands: Intact       Cueing Cueing Techniques: Verbal cues     General Comments      Exercises     Assessment/Plan    PT Assessment Patient does not need any further PT services  PT Problem List         PT Treatment Interventions      PT Goals (Current goals can be found in the Care Plan section)  Acute Rehab PT Goals Patient Stated Goal: to go home PT Goal Formulation: All assessment and education complete, DC therapy    Frequency       Co-evaluation               AM-PAC PT 6 Clicks Mobility  Outcome Measure Help needed turning from your back to your side while in a flat bed without using bedrails?: None Help needed moving from lying on your back to sitting on the side of a flat bed without using bedrails?: None Help needed moving to and from a bed  to a chair (including a wheelchair)?: None Help needed standing up from a chair using your arms (e.g., wheelchair or bedside chair)?: None Help needed to walk in hospital room?: None Help needed climbing 3-5 steps with a railing? : None 6 Click Score: 24    End of Session Equipment Utilized During Treatment: Gait belt Activity Tolerance: Patient tolerated treatment well Patient left: in bed;with call bell/phone within reach Nurse Communication: Mobility status PT Visit Diagnosis: Other abnormalities of gait and mobility (R26.89)    Time: 9146-9087 PT Time Calculation (min) (ACUTE ONLY): 19 min   Charges:   PT Evaluation $PT Eval Low Complexity: 1 Low   PT General Charges $$ ACUTE PT VISIT: 1 Visit         Janell Axe, SPT

## 2024-08-14 NOTE — Plan of Care (Signed)
  Problem: Education: Goal: Knowledge of disease or condition will improve 08/14/2024 0000 by Mick Donzell CROME, RN Outcome: Progressing 08/13/2024 2359 by Mick Donzell CROME, RN Outcome: Progressing Goal: Knowledge of secondary prevention will improve (MUST DOCUMENT ALL) 08/14/2024 0000 by Mick Donzell CROME, RN Outcome: Progressing 08/13/2024 2359 by Mick Donzell CROME, RN Outcome: Progressing Goal: Knowledge of patient specific risk factors will improve (DELETE if not current risk factor) 08/14/2024 0000 by Mick Donzell CROME, RN Outcome: Progressing 08/13/2024 2359 by Mick Donzell CROME, RN Outcome: Progressing   Problem: Ischemic Stroke/TIA Tissue Perfusion: Goal: Complications of ischemic stroke/TIA will be minimized 08/14/2024 0000 by Mick Donzell CROME, RN Outcome: Progressing 08/13/2024 2359 by Mick Donzell CROME, RN Outcome: Progressing   Problem: Coping: Goal: Will verbalize positive feelings about self 08/14/2024 0000 by Mick Donzell CROME, RN Outcome: Progressing 08/13/2024 2359 by Mick Donzell CROME, RN Outcome: Progressing Goal: Will identify appropriate support needs 08/14/2024 0000 by Mick Donzell CROME, RN Outcome: Progressing 08/13/2024 2359 by Mick Donzell CROME, RN Outcome: Progressing   Problem: Health Behavior/Discharge Planning: Goal: Ability to manage health-related needs will improve 08/14/2024 0000 by Mick Donzell CROME, RN Outcome: Progressing 08/13/2024 2359 by Mick Donzell CROME, RN Outcome: Progressing Goal: Goals will be collaboratively established with patient/family 08/14/2024 0000 by Mick Donzell CROME, RN Outcome: Progressing 08/13/2024 2359 by Mick Donzell CROME, RN Outcome: Progressing   Problem: Self-Care: Goal: Ability to participate in self-care as condition permits will improve 08/14/2024 0000 by Mick Donzell CROME, RN Outcome: Progressing 08/13/2024 2359 by Mick Donzell CROME, RN Outcome: Progressing Goal: Verbalization of feelings and concerns over difficulty with self-care will improve Outcome:  Progressing Goal: Ability to communicate needs accurately will improve Outcome: Progressing   Problem: Nutrition: Goal: Risk of aspiration will decrease Outcome: Progressing Goal: Dietary intake will improve Outcome: Progressing   Problem: Education: Goal: Knowledge of General Education information will improve Description: Including pain rating scale, medication(s)/side effects and non-pharmacologic comfort measures Outcome: Progressing   Problem: Health Behavior/Discharge Planning: Goal: Ability to manage health-related needs will improve Outcome: Progressing   Problem: Clinical Measurements: Goal: Ability to maintain clinical measurements within normal limits will improve Outcome: Progressing Goal: Will remain free from infection Outcome: Progressing Goal: Diagnostic test results will improve Outcome: Progressing Goal: Respiratory complications will improve Outcome: Progressing Goal: Cardiovascular complication will be avoided Outcome: Progressing   Problem: Activity: Goal: Risk for activity intolerance will decrease Outcome: Progressing   Problem: Nutrition: Goal: Adequate nutrition will be maintained Outcome: Progressing   Problem: Coping: Goal: Level of anxiety will decrease Outcome: Progressing   Problem: Elimination: Goal: Will not experience complications related to bowel motility Outcome: Progressing Goal: Will not experience complications related to urinary retention Outcome: Progressing   Problem: Pain Managment: Goal: General experience of comfort will improve and/or be controlled Outcome: Progressing   Problem: Safety: Goal: Ability to remain free from injury will improve Outcome: Progressing   Problem: Skin Integrity: Goal: Risk for impaired skin integrity will decrease Outcome: Progressing

## 2024-08-14 NOTE — Progress Notes (Signed)
*  PRELIMINARY RESULTS* Echocardiogram 2D Echocardiogram has been performed.  Daniel Goodman 08/14/2024, 8:05 AM

## 2024-08-14 NOTE — Plan of Care (Signed)
 Problem: Education: Goal: Knowledge of disease or condition will improve 08/14/2024 1342 by Joesph Donnell LABOR, RN Outcome: Adequate for Discharge 08/14/2024 1342 by Joesph Donnell LABOR, RN Outcome: Progressing 08/14/2024 1342 by Joesph Donnell LABOR, RN Outcome: Progressing Goal: Knowledge of secondary prevention will improve (MUST DOCUMENT ALL) 08/14/2024 1342 by Joesph Donnell LABOR, RN Outcome: Adequate for Discharge 08/14/2024 1342 by Joesph Donnell LABOR, RN Outcome: Progressing 08/14/2024 1342 by Joesph Donnell LABOR, RN Outcome: Progressing Goal: Knowledge of patient specific risk factors will improve (DELETE if not current risk factor) 08/14/2024 1342 by Joesph Donnell LABOR, RN Outcome: Adequate for Discharge 08/14/2024 1342 by Joesph Donnell LABOR, RN Outcome: Progressing 08/14/2024 1342 by Joesph Donnell LABOR, RN Outcome: Progressing   Problem: Ischemic Stroke/TIA Tissue Perfusion: Goal: Complications of ischemic stroke/TIA will be minimized 08/14/2024 1342 by Joesph Donnell LABOR, RN Outcome: Adequate for Discharge 08/14/2024 1342 by Joesph Donnell LABOR, RN Outcome: Progressing 08/14/2024 1342 by Joesph Donnell LABOR, RN Outcome: Progressing   Problem: Coping: Goal: Will verbalize positive feelings about self 08/14/2024 1342 by Joesph Donnell LABOR, RN Outcome: Adequate for Discharge 08/14/2024 1342 by Joesph Donnell LABOR, RN Outcome: Progressing 08/14/2024 1342 by Joesph Donnell LABOR, RN Outcome: Progressing Goal: Will identify appropriate support needs 08/14/2024 1342 by Joesph Donnell LABOR, RN Outcome: Adequate for Discharge 08/14/2024 1342 by Joesph Donnell LABOR, RN Outcome: Progressing 08/14/2024 1342 by Joesph Donnell LABOR, RN Outcome: Progressing   Problem: Health Behavior/Discharge Planning: Goal: Ability to manage health-related needs will improve 08/14/2024 1342 by Joesph Donnell LABOR, RN Outcome: Adequate for Discharge 08/14/2024 1342 by Joesph Donnell LABOR, RN Outcome:  Progressing 08/14/2024 1342 by Joesph Donnell LABOR, RN Outcome: Progressing Goal: Goals will be collaboratively established with patient/family 08/14/2024 1342 by Joesph Donnell LABOR, RN Outcome: Adequate for Discharge 08/14/2024 1342 by Joesph Donnell LABOR, RN Outcome: Progressing 08/14/2024 1342 by Joesph Donnell LABOR, RN Outcome: Progressing   Problem: Self-Care: Goal: Ability to participate in self-care as condition permits will improve 08/14/2024 1342 by Joesph Donnell LABOR, RN Outcome: Adequate for Discharge 08/14/2024 1342 by Joesph Donnell LABOR, RN Outcome: Progressing 08/14/2024 1342 by Joesph Donnell LABOR, RN Outcome: Progressing Goal: Verbalization of feelings and concerns over difficulty with self-care will improve 08/14/2024 1342 by Joesph Donnell LABOR, RN Outcome: Adequate for Discharge 08/14/2024 1342 by Joesph Donnell LABOR, RN Outcome: Progressing 08/14/2024 1342 by Joesph Donnell LABOR, RN Outcome: Progressing Goal: Ability to communicate needs accurately will improve 08/14/2024 1342 by Joesph Donnell LABOR, RN Outcome: Adequate for Discharge 08/14/2024 1342 by Joesph Donnell LABOR, RN Outcome: Progressing 08/14/2024 1342 by Joesph Donnell LABOR, RN Outcome: Progressing   Problem: Nutrition: Goal: Risk of aspiration will decrease 08/14/2024 1342 by Joesph Donnell LABOR, RN Outcome: Adequate for Discharge 08/14/2024 1342 by Joesph Donnell LABOR, RN Outcome: Progressing 08/14/2024 1342 by Joesph Donnell LABOR, RN Outcome: Progressing Goal: Dietary intake will improve 08/14/2024 1342 by Joesph Donnell LABOR, RN Outcome: Adequate for Discharge 08/14/2024 1342 by Joesph Donnell LABOR, RN Outcome: Progressing 08/14/2024 1342 by Joesph Donnell LABOR, RN Outcome: Progressing   Problem: Education: Goal: Knowledge of General Education information will improve Description: Including pain rating scale, medication(s)/side effects and non-pharmacologic comfort measures 08/14/2024 1342 by Joesph Donnell LABOR,  RN Outcome: Adequate for Discharge 08/14/2024 1342 by Joesph Donnell LABOR, RN Outcome: Progressing 08/14/2024 1342 by Joesph Donnell LABOR, RN Outcome: Progressing   Problem: Health Behavior/Discharge Planning: Goal: Ability to manage health-related needs will improve 08/14/2024 1342 by Joesph Donnell LABOR, RN Outcome: Adequate for Discharge 08/14/2024 1342 by Joesph Donnell  A, RN Outcome: Progressing 08/14/2024 1342 by Joesph Donnell LABOR, RN Outcome: Progressing   Problem: Clinical Measurements: Goal: Ability to maintain clinical measurements within normal limits will improve 08/14/2024 1342 by Joesph Donnell LABOR, RN Outcome: Adequate for Discharge 08/14/2024 1342 by Joesph Donnell LABOR, RN Outcome: Progressing 08/14/2024 1342 by Joesph Donnell LABOR, RN Outcome: Progressing Goal: Will remain free from infection 08/14/2024 1342 by Joesph Donnell LABOR, RN Outcome: Adequate for Discharge 08/14/2024 1342 by Joesph Donnell LABOR, RN Outcome: Progressing 08/14/2024 1342 by Joesph Donnell LABOR, RN Outcome: Progressing Goal: Diagnostic test results will improve 08/14/2024 1342 by Joesph Donnell LABOR, RN Outcome: Adequate for Discharge 08/14/2024 1342 by Joesph Donnell LABOR, RN Outcome: Progressing 08/14/2024 1342 by Joesph Donnell LABOR, RN Outcome: Progressing Goal: Respiratory complications will improve 08/14/2024 1342 by Joesph Donnell LABOR, RN Outcome: Adequate for Discharge 08/14/2024 1342 by Joesph Donnell LABOR, RN Outcome: Progressing 08/14/2024 1342 by Joesph Donnell LABOR, RN Outcome: Progressing Goal: Cardiovascular complication will be avoided 08/14/2024 1342 by Joesph Donnell LABOR, RN Outcome: Adequate for Discharge 08/14/2024 1342 by Joesph Donnell LABOR, RN Outcome: Progressing 08/14/2024 1342 by Joesph Donnell LABOR, RN Outcome: Progressing   Problem: Activity: Goal: Risk for activity intolerance will decrease 08/14/2024 1342 by Joesph Donnell LABOR, RN Outcome: Adequate for Discharge 08/14/2024  1342 by Joesph Donnell LABOR, RN Outcome: Progressing 08/14/2024 1342 by Joesph Donnell LABOR, RN Outcome: Progressing   Problem: Nutrition: Goal: Adequate nutrition will be maintained 08/14/2024 1342 by Joesph Donnell LABOR, RN Outcome: Adequate for Discharge 08/14/2024 1342 by Joesph Donnell LABOR, RN Outcome: Progressing 08/14/2024 1342 by Joesph Donnell LABOR, RN Outcome: Progressing   Problem: Coping: Goal: Level of anxiety will decrease 08/14/2024 1342 by Joesph Donnell LABOR, RN Outcome: Adequate for Discharge 08/14/2024 1342 by Joesph Donnell LABOR, RN Outcome: Progressing 08/14/2024 1342 by Joesph Donnell LABOR, RN Outcome: Progressing   Problem: Elimination: Goal: Will not experience complications related to bowel motility 08/14/2024 1342 by Joesph Donnell LABOR, RN Outcome: Adequate for Discharge 08/14/2024 1342 by Joesph Donnell LABOR, RN Outcome: Progressing 08/14/2024 1342 by Joesph Donnell LABOR, RN Outcome: Progressing Goal: Will not experience complications related to urinary retention 08/14/2024 1342 by Joesph Donnell LABOR, RN Outcome: Adequate for Discharge 08/14/2024 1342 by Joesph Donnell LABOR, RN Outcome: Progressing 08/14/2024 1342 by Joesph Donnell LABOR, RN Outcome: Progressing   Problem: Pain Managment: Goal: General experience of comfort will improve and/or be controlled 08/14/2024 1342 by Joesph Donnell LABOR, RN Outcome: Adequate for Discharge 08/14/2024 1342 by Joesph Donnell LABOR, RN Outcome: Progressing 08/14/2024 1342 by Joesph Donnell LABOR, RN Outcome: Progressing   Problem: Safety: Goal: Ability to remain free from injury will improve 08/14/2024 1342 by Joesph Donnell LABOR, RN Outcome: Adequate for Discharge 08/14/2024 1342 by Joesph Donnell LABOR, RN Outcome: Progressing 08/14/2024 1342 by Joesph Donnell LABOR, RN Outcome: Progressing   Problem: Skin Integrity: Goal: Risk for impaired skin integrity will decrease 08/14/2024 1342 by Joesph Donnell LABOR, RN Outcome: Adequate for  Discharge 08/14/2024 1342 by Joesph Donnell LABOR, RN Outcome: Progressing 08/14/2024 1342 by Joesph Donnell LABOR, RN Outcome: Progressing

## 2024-08-14 NOTE — Plan of Care (Signed)

## 2024-08-15 ENCOUNTER — Telehealth: Payer: Self-pay

## 2024-08-15 NOTE — Transitions of Care (Post Inpatient/ED Visit) (Signed)
   08/15/2024  Name: Daniel Goodman MRN: [id number] DOB: [birth year]  Today's TOC FU Call Status: Today's TOC FU Call Status:: Successful TOC FU Call Completed TOC FU Call Complete Date: 08/15/24 Patient's Name and Date of Birth confirmed.  Transition Care Management Follow-up Telephone Call Date of Discharge: 08/14/24 Discharge Facility: [facility]) Type of Discharge: Inpatient Admission Primary Inpatient Discharge Diagnosis:: stroke How have you been since you were released from the hospital?: Better Any questions or concerns?: No  Items Reviewed: Did you receive and understand the discharge instructions provided?: Yes Medications obtained,verified, and reconciled?: Yes (Medications Reviewed) Any new allergies since your discharge?: No Dietary orders reviewed?: Yes Do you have support at home?: Yes People in Home [RPT]: parent(s)  Medications Reviewed Today: Medications Reviewed Today     Reviewed by Emmitt Pan, LPN (Licensed Practical Nurse) on 08/15/24 at 1328  Med List Status: <None>   Medication Order Taking? Sig Documenting Provider Last Dose Status Informant  albuterol  (VENTOLIN  HFA) 108 (90 Base) MCG/ACT inhaler [id number] Yes Inhale 1-2 puffs into the lungs every 4 (four) hours as needed for wheezing or shortness of breath. Van Knee, MD  Active Self  amLODipine (NORVASC) 10 MG tablet [id number] Yes Take 1 tablet (10 mg total) by mouth daily. Patel, Sona, MD  Active   aspirin EC 81 MG tablet [id number] Yes Take 1 tablet (81 mg total) by mouth daily. Swallow whole. Patel, Sona, MD  Active   atorvastatin (LIPITOR) 40 MG tablet [id number] Yes Take 1 tablet (40 mg total) by mouth daily. Patel, Sona, MD  Active   clopidogrel (PLAVIX) 75 MG tablet [id number] Yes Take 1 tablet (75 mg total) by mouth daily for 21 days. Patel, Sona, MD  Active   ipratropium (ATROVENT ) 0.06 % nasal spray [id number] Yes Place 2 sprays into both nostrils 4 (four)  times daily. Van Knee, MD  Active Self  lisinopril (ZESTRIL) 10 MG tablet [id number] Yes Take 1 tablet (10 mg total) by mouth daily. Patel, Sona, MD  Active   omeprazole (PRILOSEC) 20 MG capsule [id number] Yes Take 1 capsule by mouth daily. [provider]  Active             Home Care and Equipment/Supplies: Were Home Health Services Ordered?: NA Any new equipment or medical supplies ordered?: NA  Functional Questionnaire: Do you need assistance with bathing/showering or dressing?: No Do you need assistance with meal preparation?: No Do you need assistance with eating?: No Do you have difficulty maintaining continence: No Do you need assistance with getting out of bed/getting out of a chair/moving?: No Do you have difficulty managing or taking your medications?: No  Follow up appointments reviewed: PCP Follow-up appointment confirmed?: Yes Date of PCP follow-up appointment?: 08/20/24 Follow-up Provider: [facility] Follow-up appointment confirmed?: Yes Date of Specialist follow-up appointment?: 08/20/24 Follow-Up Specialty Provider:: neuro Do you need transportation to your follow-up appointment?: No Do you understand care options if your condition(s) worsen?: Yes-patient verbalized understanding    SIGNATURE Pan Emmitt, LPN [facility] Nurse Health Advisor Direct Dial [phone]

## 2024-08-20 ENCOUNTER — Encounter: Payer: Self-pay | Admitting: Physician Assistant

## 2024-08-20 ENCOUNTER — Ambulatory Visit (INDEPENDENT_AMBULATORY_CARE_PROVIDER_SITE_OTHER): Payer: Self-pay | Admitting: Physician Assistant

## 2024-08-20 VITALS — BP 132/96 | HR 82 | Temp 97.9°F | Ht 73.0 in | Wt 240.0 lb

## 2024-08-20 DIAGNOSIS — I10 Essential (primary) hypertension: Secondary | ICD-10-CM

## 2024-08-20 DIAGNOSIS — F17200 Nicotine dependence, unspecified, uncomplicated: Secondary | ICD-10-CM | POA: Insufficient documentation

## 2024-08-20 DIAGNOSIS — Z720 Tobacco use: Secondary | ICD-10-CM | POA: Insufficient documentation

## 2024-08-20 DIAGNOSIS — Z8673 Personal history of transient ischemic attack (TIA), and cerebral infarction without residual deficits: Secondary | ICD-10-CM

## 2024-08-20 DIAGNOSIS — F1410 Cocaine abuse, uncomplicated: Secondary | ICD-10-CM

## 2024-08-20 MED ORDER — ATORVASTATIN CALCIUM 40 MG PO TABS: 40.0000 mg | ORAL_TABLET | Freq: Every day | ORAL | 1 refills | Status: AC

## 2024-08-20 MED ORDER — CLOPIDOGREL BISULFATE 75 MG PO TABS: 75.0000 mg | ORAL_TABLET | Freq: Every day | ORAL | 3 refills | Status: DC

## 2024-08-20 MED ORDER — ASPIRIN 81 MG PO TBEC: 81.0000 mg | DELAYED_RELEASE_TABLET | Freq: Every day | ORAL | 1 refills | Status: AC

## 2024-08-20 MED ORDER — AMLODIPINE BESYLATE 10 MG PO TABS: 10.0000 mg | ORAL_TABLET | Freq: Every day | ORAL | 1 refills | Status: DC

## 2024-08-20 MED ORDER — LISINOPRIL 20 MG PO TABS: 10.0000 mg | ORAL_TABLET | Freq: Every day | ORAL | 1 refills | Status: AC

## 2024-08-20 NOTE — Assessment & Plan Note (Signed)
 Encouraged cessation, referring to quit program. Discussed the role of nicotine as vasoconstrictor which is counterproductive in hypertension control, especially with history of stroke.

## 2024-08-20 NOTE — Assessment & Plan Note (Signed)
 Hospitalist called for 21 days DAPT followed by aspirin monotherapy.  Will be refilling his clopidogrel today so he may continue DAPT until he follows up with neurology.    Encouraged to schedule appoint with [facility] neurology where he is already established; he was given their contact information today.

## 2024-08-20 NOTE — Progress Notes (Addendum)
 Date:  08/20/2024   Name:  Daniel Goodman   DOB:  01-18-1981   MRN:  [id number]   Chief Complaint: Hospitalization Follow-up (Feels ok, still having numbness, right side of lip and right hand finger tips and in the middle of hand )  HPI  Daniel Goodman returns to our clinic for hospital follow-up admitted to [facility] 08/13/2024 - 08/14/2024 for acute left thalamocapsular CVA.  Transition of care call completed 08/15/2024.  He had woken up the morning of 08/13/2024 with numbness/paresthesia of the right side face, arm, and leg without motor weakness, vision changes, or dysarthria.  Symptoms slowly improving but still complains of paresthesia primarily in the right hand. Cognition is intact along with motor function.   Unfortunately he was lost to follow-up over 1.5 years ago at which point we were exploring whether or not antihypertensive medication was truly necessary.  He was supposed to have short interval follow-up, but never came back.  History of cocaine abuse, with last use about 2 days prior to onset of symptoms. Also struggles with tobacco use disorder historically. Says he is smoking fewer cigarettes but he picked up a nicotine-containing vape habit.   Medication list has been reviewed and updated.  Current Meds  Medication Sig   albuterol  (VENTOLIN  HFA) 108 (90 Base) MCG/ACT inhaler Inhale 1-2 puffs into the lungs every 4 (four) hours as needed for wheezing or shortness of breath.   ipratropium (ATROVENT ) 0.06 % nasal spray Place 2 sprays into both nostrils 4 (four) times daily.   omeprazole (PRILOSEC) 20 MG capsule Take 1 capsule by mouth daily.   [DISCONTINUED] amLODipine (NORVASC) 10 MG tablet Take 1 tablet (10 mg total) by mouth daily.   [DISCONTINUED] aspirin EC 81 MG tablet Take 1 tablet (81 mg total) by mouth daily. Swallow whole.   [DISCONTINUED] atorvastatin (LIPITOR) 40 MG tablet Take 1 tablet (40 mg total) by mouth daily.   [DISCONTINUED] clopidogrel (PLAVIX) 75 MG tablet Take 1  tablet (75 mg total) by mouth daily for 21 days.   [DISCONTINUED] lisinopril (ZESTRIL) 10 MG tablet Take 1 tablet (10 mg total) by mouth daily.     Review of Systems  Patient Active Problem List   Diagnosis Date Noted   Vapes nicotine containing substance 08/20/2024   Tobacco use disorder 08/20/2024   Cocaine abuse (HCC) 08/14/2024   History of stroke 08/14/2024   Hypertension 01/25/2023   Blood in stool 01/25/2023   Other microscopic hematuria 01/25/2023   Seizure disorder (HCC) 10/17/2014    No Known Allergies  Immunization History  Administered Date(s) Administered   Pneumococcal Polysaccharide-23 10/18/2014   Tdap 11/25/2015    History reviewed. No pertinent surgical history.  Social History   Tobacco Use   Smoking status: Every Day    Types: Cigars  Vaping Use   Vaping status: Never Used  Substance Use Topics   Alcohol use: Yes    Comment: occasionally   Drug use: Yes    Family History  Problem Relation Age of Onset   Cancer Mother    Hypertension Father    Diabetes Father         08/20/2024    3:28 PM 02/01/2023    8:04 AM 01/25/2023    8:53 AM  GAD 7 : Generalized Anxiety Score  Nervous, Anxious, on Edge 0 0 0  Control/stop worrying 0 0 0  Worry too much - different things 0 0 0  Trouble relaxing 0 0 0  Restless 0 0 1  Easily annoyed or irritable 0 1 1  Afraid - awful might happen 0 1 0  Total GAD 7 Score 0 2 2  Anxiety Difficulty Not difficult at all Not difficult at all Not difficult at all       08/20/2024    3:28 PM 02/01/2023    8:04 AM 01/25/2023    8:53 AM  Depression screen PHQ 2/9  Decreased Interest 0 0 0  Down, Depressed, Hopeless 0 0 0  PHQ - 2 Score 0 0 0  Altered sleeping  0 1  Tired, decreased energy  0 1  Change in appetite  0 0  Feeling bad or failure about yourself   0 0  Trouble concentrating  0 0  Moving slowly or fidgety/restless  0 0  Suicidal thoughts  0 0  PHQ-9 Score  0 2  Difficult doing work/chores  Not  difficult at all Not difficult at all    BP Readings from Last 3 Encounters:  08/20/24 (!) 132/96  08/14/24 (!) 160/96  07/03/24 (!) 165/109    Wt Readings from Last 3 Encounters:  08/20/24 240 lb (108.9 kg)  08/13/24 238 lb (108 kg)  07/03/24 234 lb (106.1 kg)    BP (!) 132/96 (Cuff Size: Large)   Pulse 82   Temp 97.9 F (36.6 C)   Ht 6' 1 (1.854 m)   Wt 240 lb (108.9 kg)   SpO2 97%   BMI 31.66 kg/m   Physical Exam Vitals and nursing note reviewed.  Constitutional:      Appearance: Normal appearance.  Eyes:     Pupils: Pupils are equal, round, and reactive to light.  Cardiovascular:     Rate and Rhythm: Normal rate and regular rhythm.     Heart sounds: No murmur heard.    No friction rub. No gallop.  Pulmonary:     Effort: Pulmonary effort is normal.     Breath sounds: Normal breath sounds.  Abdominal:     General: There is no distension.  Musculoskeletal:        General: Normal range of motion.     Comments: Strength 5/5 bilateral arms, legs, and hands.   Skin:    General: Skin is warm and dry.  Neurological:     Mental Status: He is alert and oriented to person, place, and time.     Cranial Nerves: Cranial nerves 2-12 are intact.     Motor: Motor function is intact.     Coordination: Coordination is intact. Finger-Nose-Finger Test normal.     Gait: Gait is intact.     Comments: All facial muscles contracting symmetrically  Psychiatric:        Mood and Affect: Mood and affect normal.     Recent Labs     Component Value Date/Time   NA 137 08/13/2024 0740   NA 134 (A) 12/05/2022 0000   NA 140 07/26/2014 2051   K 3.8 08/13/2024 0740   K 3.9 07/26/2014 2051   CL 102 08/13/2024 0740   CL 108 (H) 07/26/2014 2051   CO2 23 08/13/2024 0740   CO2 22 07/26/2014 2051   GLUCOSE 105 (H) 08/13/2024 0740   GLUCOSE 84 07/26/2014 2051   BUN 14 08/13/2024 0740   BUN 9 12/05/2022 0000   BUN 13 07/26/2014 2051   CREATININE 1.41 (H) 08/13/2024 0740   CREATININE  1.29 07/26/2014 2051   CALCIUM 9.0 08/13/2024 0740   CALCIUM 8.5 07/26/2014 2051   PROT 7.1 08/13/2024 0740  PROT 7.3 07/26/2014 2051   ALBUMIN 3.6 08/13/2024 0740   ALBUMIN 3.4 07/26/2014 2051   AST 14 (L) 08/13/2024 0740   AST 25 07/26/2014 2051   ALT 13 08/13/2024 0740   ALT 22 07/26/2014 2051   ALKPHOS 70 08/13/2024 0740   ALKPHOS 85 07/26/2014 2051   BILITOT 0.6 08/13/2024 0740   BILITOT 0.2 07/26/2014 2051   GFRNONAA >60 08/13/2024 0740   GFRNONAA >60 07/26/2014 2051   GFRAA >60 09/26/2015 0045   GFRAA >60 07/26/2014 2051    Lab Results  Component Value Date   WBC 7.4 08/13/2024   HGB 14.7 08/13/2024   HCT 45.6 08/13/2024   MCV 85.2 08/13/2024   PLT 315 08/13/2024   Lab Results  Component Value Date   HGBA1C 5.2 08/13/2024   Lab Results  Component Value Date   CHOL 186 08/14/2024   HDL 40 (L) 08/14/2024   LDLCALC 114 (H) 08/14/2024   TRIG 158 (H) 08/14/2024   CHOLHDL 4.7 08/14/2024   No results found for: TSH    Assessment and Plan:  History of stroke Assessment & Plan: Hospitalist called for 21 days DAPT followed by aspirin monotherapy.  Will be refilling his clopidogrel today so he may continue DAPT until he follows up with neurology.    Encouraged to schedule appoint with [facility] neurology where he is already established; he was given their contact information today.  Orders: -     Clopidogrel Bisulfate; Take 1 tablet (75 mg total) by mouth daily.  Dispense: 90 tablet; Refill: 3 -     Atorvastatin Calcium; Take 1 tablet (40 mg total) by mouth daily.  Dispense: 90 tablet; Refill: 1 -     Aspirin; Take 1 tablet (81 mg total) by mouth daily. Swallow whole.  Dispense: 360 tablet; Refill: 1  Hypertension, unspecified type Assessment & Plan: Continue amlodipine 10 mg daily, refill lisinopril at 20 mg daily dose.   Orders: -     Lisinopril; Take 0.5 tablets (10 mg total) by mouth daily.  Dispense: 90 tablet; Refill: 1 -     amLODIPine Besylate; Take 1  tablet (10 mg total) by mouth daily.  Dispense: 90 tablet; Refill: 1  Vapes nicotine containing substance Assessment & Plan: Encouraged cessation, referring to quit program. Discussed the role of nicotine as vasoconstrictor which is counterproductive in hypertension control, especially with history of stroke.  Orders: -     Ambulatory referral to Virtual Care Smoking Cessation  Tobacco use disorder Assessment & Plan: Encouraged cessation, referring to quit program. Discussed the role of nicotine as vasoconstrictor which is counterproductive in hypertension control, especially with history of stroke.  Orders: -     Ambulatory referral to Virtual Care Smoking Cessation  Cocaine abuse [facility]) Assessment & Plan: Patient is adamant that he has not used cocaine since the stroke.  Advised that he never use this substance again and he verbalizes understanding.      Return in about 2 weeks (around 09/03/2024) for OV f/u HTN.    Rolan Hoyle, PA-C, DMSc, Nutritionist [facility] [phone]

## 2024-08-20 NOTE — Assessment & Plan Note (Signed)
 Continue amlodipine 10 mg daily, refill lisinopril at 20 mg daily dose.

## 2024-08-20 NOTE — Assessment & Plan Note (Signed)
 Patient is adamant that he has not used cocaine since the stroke.  Advised that he never use this substance again and he verbalizes understanding.

## 2024-08-22 NOTE — Addendum Note (Signed)
 Addended by: MANYA TORIBIO SQUIBB on: 08/22/2024 12:01 PM   Modules accepted: Level of Service

## 2024-09-07 ENCOUNTER — Ambulatory Visit: Payer: Self-pay | Admitting: Physician Assistant

## 2024-09-14 ENCOUNTER — Encounter: Payer: Self-pay | Admitting: Physician Assistant

## 2024-09-14 ENCOUNTER — Ambulatory Visit: Payer: Self-pay | Admitting: Physician Assistant

## 2024-09-14 VITALS — BP 94/62 | HR 91 | Temp 97.7°F | Ht 73.0 in | Wt 238.0 lb

## 2024-09-14 DIAGNOSIS — F17200 Nicotine dependence, unspecified, uncomplicated: Secondary | ICD-10-CM | POA: Diagnosis not present

## 2024-09-14 DIAGNOSIS — F1411 Cocaine abuse, in remission: Secondary | ICD-10-CM | POA: Diagnosis not present

## 2024-09-14 DIAGNOSIS — I10 Essential (primary) hypertension: Secondary | ICD-10-CM

## 2024-09-14 DIAGNOSIS — Z8673 Personal history of transient ischemic attack (TIA), and cerebral infarction without residual deficits: Secondary | ICD-10-CM | POA: Diagnosis not present

## 2024-09-14 MED ORDER — AMLODIPINE BESYLATE 10 MG PO TABS: 5.0000 mg | ORAL_TABLET | Freq: Every day | ORAL | Status: AC

## 2024-09-14 NOTE — Progress Notes (Signed)
 Date:  09/14/2024   Name:  Daniel Goodman   DOB:  Jul 08, 1981   MRN:  [id number]   Chief Complaint: Hypertension ( Bruising on legs easily-Need referral for neurology- for stroke  )  HPI  Daniel Goodman returns for 3-week follow-up for HTN and also s/p recent CVA.  He reports continued avoidance of cocaine following the CVA, also has completely stopped his nicotine containing vape.  Still smoking a cigar occasionally, but has drastically reduced as he works toward total cessation.  Last visit I refilled his lisinopril, which he takes with amlodipine reporting good compliance.  He has been having peripheral edema which is bothersome to him.  He also reports easy bruising of the lower extremities, presently on DAPT.  He is now 1 month out from the stroke.  Still complains of right thumb numbness, but motor control is preserved.  He was advised to make follow-up with [facility] neurology where he was previously established, but unfortunately they will not be able to see him until January 2026.  He would be interested in seeing a different group if he can be seen sooner.  (PRIOR NOTE 08/20/24): Daniel Goodman returns to our clinic for hospital follow-up admitted to [facility] 08/13/2024 - 08/14/2024 for acute left thalamocapsular CVA.  Transition of care call completed 08/15/2024.  He had woken up the morning of 08/13/2024 with numbness/paresthesia of the right side face, arm, and leg without motor weakness, vision changes, or dysarthria.  Symptoms slowly improving but still complains of paresthesia primarily in the right hand. Cognition is intact along with motor function.  Unfortunately he was lost to follow-up over 1.5 years ago at which point we were exploring whether or not antihypertensive medication was truly necessary.  He was supposed to have short interval follow-up, but never came back. History of cocaine abuse, with last use about 2 days prior to onset of symptoms. Also struggles with tobacco use disorder historically. Says  he is smoking fewer cigarettes but he picked up a nicotine-containing vape habit.     Medication list has been reviewed and updated.  Current Meds  Medication Sig   albuterol  (VENTOLIN  HFA) 108 (90 Base) MCG/ACT inhaler Inhale 1-2 puffs into the lungs every 4 (four) hours as needed for wheezing or shortness of breath.   aspirin EC 81 MG tablet Take 1 tablet (81 mg total) by mouth daily. Swallow whole.   atorvastatin (LIPITOR) 40 MG tablet Take 1 tablet (40 mg total) by mouth daily.   ipratropium (ATROVENT ) 0.06 % nasal spray Place 2 sprays into both nostrils 4 (four) times daily.   lisinopril (ZESTRIL) 20 MG tablet Take 0.5 tablets (10 mg total) by mouth daily.   omeprazole (PRILOSEC) 20 MG capsule Take 1 capsule by mouth daily.   [DISCONTINUED] amLODipine (NORVASC) 10 MG tablet Take 1 tablet (10 mg total) by mouth daily.   [DISCONTINUED] clopidogrel (PLAVIX) 75 MG tablet Take 1 tablet (75 mg total) by mouth daily.     Review of Systems  Patient Active Problem List   Diagnosis Date Noted   Vapes nicotine containing substance 08/20/2024   Tobacco use disorder 08/20/2024   Cocaine abuse in early remission (HCC) 08/14/2024   History of stroke 08/14/2024   Hypertension 01/25/2023   Blood in stool 01/25/2023   Other microscopic hematuria 01/25/2023   Seizure disorder (HCC) 10/17/2014    No Known Allergies  Immunization History  Administered Date(s) Administered   Pneumococcal Polysaccharide-23 10/18/2014   Tdap 11/25/2015    History reviewed.  No pertinent surgical history.  Social History   Tobacco Use   Smoking status: Some Days    Types: Cigars  Vaping Use   Vaping status: Never Used  Substance Use Topics   Alcohol use: Yes    Comment: occasionally   Drug use: Yes    Family History  Problem Relation Age of Onset   Cancer Mother    Hypertension Father    Diabetes Father         09/14/2024    3:00 PM 08/20/2024    3:28 PM 02/01/2023    8:04 AM 01/25/2023     8:53 AM  GAD 7 : Generalized Anxiety Score  Nervous, Anxious, on Edge 0 0 0 0  Control/stop worrying 0 0 0 0  Worry too much - different things 0 0 0 0  Trouble relaxing 0 0 0 0  Restless 0 0 0 1  Easily annoyed or irritable 0 0 1 1  Afraid - awful might happen 0 0 1 0  Total GAD 7 Score 0 0 2 2  Anxiety Difficulty Not difficult at all Not difficult at all Not difficult at all Not difficult at all       09/14/2024    3:00 PM 08/20/2024    3:28 PM 02/01/2023    8:04 AM  Depression screen PHQ 2/9  Decreased Interest 0 0 0  Down, Depressed, Hopeless 0 0 0  PHQ - 2 Score 0 0 0  Altered sleeping   0  Tired, decreased energy   0  Change in appetite   0  Feeling bad or failure about yourself    0  Trouble concentrating   0  Moving slowly or fidgety/restless   0  Suicidal thoughts   0  PHQ-9 Score   0   Difficult doing work/chores   Not difficult at all     Data saved with a previous flowsheet row definition    BP Readings from Last 3 Encounters:  09/14/24 94/62  08/20/24 (!) 132/96  08/14/24 (!) 160/96    Wt Readings from Last 3 Encounters:  09/14/24 238 lb (108 kg)  08/20/24 240 lb (108.9 kg)  08/13/24 238 lb (108 kg)    BP 94/62 (Cuff Size: Normal)   Pulse 91   Temp 97.7 F (36.5 C)   Ht 6' 1 (1.854 m)   Wt 238 lb (108 kg)   SpO2 96%   BMI 31.40 kg/m   Physical Exam Vitals and nursing note reviewed.  Constitutional:      Appearance: Normal appearance.  Cardiovascular:     Rate and Rhythm: Normal rate.  Pulmonary:     Effort: Pulmonary effort is normal.  Abdominal:     General: There is no distension.  Musculoskeletal:        General: Normal range of motion.  Skin:    General: Skin is warm and dry.  Neurological:     Mental Status: He is alert and oriented to person, place, and time.     Gait: Gait is intact.  Psychiatric:        Mood and Affect: Mood and affect normal.     Recent Labs     Component Value Date/Time   NA 137 08/13/2024 0740    NA 134 (A) 12/05/2022 0000   NA 140 07/26/2014 2051   K 3.8 08/13/2024 0740   K 3.9 07/26/2014 2051   CL 102 08/13/2024 0740   CL 108 (H) 07/26/2014 2051  CO2 23 08/13/2024 0740   CO2 22 07/26/2014 2051   GLUCOSE 105 (H) 08/13/2024 0740   GLUCOSE 84 07/26/2014 2051   BUN 14 08/13/2024 0740   BUN 9 12/05/2022 0000   BUN 13 07/26/2014 2051   CREATININE 1.41 (H) 08/13/2024 0740   CREATININE 1.29 07/26/2014 2051   CALCIUM 9.0 08/13/2024 0740   CALCIUM 8.5 07/26/2014 2051   PROT 7.1 08/13/2024 0740   PROT 7.3 07/26/2014 2051   ALBUMIN 3.6 08/13/2024 0740   ALBUMIN 3.4 07/26/2014 2051   AST 14 (L) 08/13/2024 0740   AST 25 07/26/2014 2051   ALT 13 08/13/2024 0740   ALT 22 07/26/2014 2051   ALKPHOS 70 08/13/2024 0740   ALKPHOS 85 07/26/2014 2051   BILITOT 0.6 08/13/2024 0740   BILITOT 0.2 07/26/2014 2051   GFRNONAA >60 08/13/2024 0740   GFRNONAA >60 07/26/2014 2051   GFRAA >60 09/26/2015 0045   GFRAA >60 07/26/2014 2051    Lab Results  Component Value Date   WBC 7.4 08/13/2024   HGB 14.7 08/13/2024   HCT 45.6 08/13/2024   MCV 85.2 08/13/2024   PLT 315 08/13/2024   Lab Results  Component Value Date   HGBA1C 5.2 08/13/2024   Lab Results  Component Value Date   CHOL 186 08/14/2024   HDL 40 (L) 08/14/2024   LDLCALC 114 (H) 08/14/2024   TRIG 158 (H) 08/14/2024   CHOLHDL 4.7 08/14/2024   No results found for: TSH    Assessment and Plan:  Hypertension, unspecified type Assessment & Plan: Suspect that following cessation of nicotine, current medication regimen is too potent given hypotensive BP today.  Reduce amlodipine to 5 mg which also help with his peripheral edema.  If blood pressure remains controlled at this dose, patient was advised he can attempt to stop this medication entirely as long as he continues monitoring blood pressure at home.  He verbalizes understanding.  Orders: -     amLODIPine Besylate; Take 0.5 tablets (5 mg total) by mouth  daily.  Tobacco use disorder Assessment & Plan: Congratulated on progress so far, encouraged continued work toward complete cessation.   Cocaine abuse in early remission [facility]) Assessment & Plan: Congratulated on ongoing avoidance of cocaine.  Recommend lifelong abstinence from this and other illicit drugs.   History of stroke Assessment & Plan: Will attempt to find sooner appointment for him to follow-up with neurology in the [facility] area if at all possible      Return in about 4 weeks (around 10/12/2024) for OV f/u HTN.    Rolan Hoyle, PA-C, DMSc, Nutritionist [facility] [phone]

## 2024-09-14 NOTE — Assessment & Plan Note (Signed)
 Will attempt to find sooner appointment for him to follow-up with neurology in the [facility] area if at all possible

## 2024-09-14 NOTE — Assessment & Plan Note (Signed)
 Suspect that following cessation of nicotine, current medication regimen is too potent given hypotensive BP today.  Reduce amlodipine to 5 mg which also help with his peripheral edema.  If blood pressure remains controlled at this dose, patient was advised he can attempt to stop this medication entirely as long as he continues monitoring blood pressure at home.  He verbalizes understanding.

## 2024-09-14 NOTE — Assessment & Plan Note (Signed)
 Congratulated on ongoing avoidance of cocaine.  Recommend lifelong abstinence from this and other illicit drugs.

## 2024-09-14 NOTE — Assessment & Plan Note (Signed)
 Congratulated on progress so far, encouraged continued work toward complete cessation.

## 2024-10-12 ENCOUNTER — Ambulatory Visit: Admitting: Physician Assistant

## 2024-10-19 ENCOUNTER — Encounter: Payer: Self-pay | Admitting: Physician Assistant

## 2024-10-19 ENCOUNTER — Ambulatory Visit: Admitting: Physician Assistant
